# Patient Record
Sex: Female | Born: 1960 | Race: White | Hispanic: No | Marital: Married | Smoking: Never smoker
Health system: Southern US, Community
[De-identification: ages and names within clinical notes are randomized; demographics above are authoritative.]

## PROBLEM LIST (undated history)

## (undated) DIAGNOSIS — R002 Palpitations: Secondary | ICD-10-CM

## (undated) DIAGNOSIS — I700 Atherosclerosis of aorta: Secondary | ICD-10-CM

## (undated) DIAGNOSIS — E785 Hyperlipidemia, unspecified: Secondary | ICD-10-CM

## (undated) DIAGNOSIS — R931 Abnormal findings on diagnostic imaging of heart and coronary circulation: Secondary | ICD-10-CM

## (undated) DIAGNOSIS — U071 COVID-19: Secondary | ICD-10-CM

## (undated) DIAGNOSIS — I2510 Atherosclerotic heart disease of native coronary artery without angina pectoris: Secondary | ICD-10-CM

## (undated) HISTORY — DX: Atherosclerotic heart disease of native coronary artery without angina pectoris: I25.10

## (undated) HISTORY — DX: Abnormal findings on diagnostic imaging of heart and coronary circulation: R93.1

## (undated) HISTORY — PX: OTHER SURGICAL HISTORY: SHX169

## (undated) HISTORY — DX: Atherosclerosis of aorta: I70.0

## (undated) HISTORY — DX: Hyperlipidemia, unspecified: E78.5

---

## 2006-10-21 ENCOUNTER — Encounter: Admission: RE | Admit: 2006-10-21 | Discharge: 2006-10-21 | Payer: Self-pay | Admitting: Family Medicine

## 2006-10-21 ENCOUNTER — Other Ambulatory Visit: Admission: RE | Admit: 2006-10-21 | Discharge: 2006-10-21 | Payer: Self-pay | Admitting: Obstetrics and Gynecology

## 2007-03-05 ENCOUNTER — Encounter: Admission: RE | Admit: 2007-03-05 | Discharge: 2007-03-05 | Payer: Self-pay | Admitting: Obstetrics and Gynecology

## 2007-11-11 ENCOUNTER — Encounter: Admission: RE | Admit: 2007-11-11 | Discharge: 2007-11-11 | Payer: Self-pay | Admitting: Obstetrics and Gynecology

## 2007-12-03 ENCOUNTER — Other Ambulatory Visit: Admission: RE | Admit: 2007-12-03 | Discharge: 2007-12-03 | Payer: Self-pay | Admitting: Obstetrics and Gynecology

## 2008-11-28 ENCOUNTER — Encounter: Admission: RE | Admit: 2008-11-28 | Discharge: 2008-11-28 | Payer: Self-pay | Admitting: Obstetrics and Gynecology

## 2008-12-05 ENCOUNTER — Other Ambulatory Visit: Admission: RE | Admit: 2008-12-05 | Discharge: 2008-12-05 | Payer: Self-pay | Admitting: Obstetrics and Gynecology

## 2009-12-07 ENCOUNTER — Encounter: Admission: RE | Admit: 2009-12-07 | Discharge: 2009-12-07 | Payer: Self-pay | Admitting: Obstetrics and Gynecology

## 2009-12-07 ENCOUNTER — Other Ambulatory Visit: Admission: RE | Admit: 2009-12-07 | Discharge: 2009-12-07 | Payer: Self-pay | Admitting: Obstetrics and Gynecology

## 2010-12-31 ENCOUNTER — Other Ambulatory Visit: Payer: Self-pay | Admitting: Obstetrics and Gynecology

## 2010-12-31 DIAGNOSIS — Z1231 Encounter for screening mammogram for malignant neoplasm of breast: Secondary | ICD-10-CM

## 2011-01-17 ENCOUNTER — Ambulatory Visit
Admission: RE | Admit: 2011-01-17 | Discharge: 2011-01-17 | Disposition: A | Discharge status: Home / Self Care | Payer: BC Managed Care – PPO | Source: Ambulatory Visit | Attending: Obstetrics and Gynecology | Admitting: Obstetrics and Gynecology

## 2011-01-17 ENCOUNTER — Other Ambulatory Visit: Payer: Self-pay | Admitting: Obstetrics and Gynecology

## 2011-01-17 ENCOUNTER — Other Ambulatory Visit (HOSPITAL_COMMUNITY)
Admission: RE | Admit: 2011-01-17 | Discharge: 2011-01-17 | Disposition: A | Discharge status: Home / Self Care | Payer: BC Managed Care – PPO | Source: Ambulatory Visit | Attending: Obstetrics and Gynecology | Admitting: Obstetrics and Gynecology

## 2011-01-17 ENCOUNTER — Ambulatory Visit: Payer: Self-pay

## 2011-01-17 DIAGNOSIS — Z1231 Encounter for screening mammogram for malignant neoplasm of breast: Secondary | ICD-10-CM

## 2011-01-17 DIAGNOSIS — Z01419 Encounter for gynecological examination (general) (routine) without abnormal findings: Secondary | ICD-10-CM | POA: Insufficient documentation

## 2014-02-05 ENCOUNTER — Encounter: Payer: Self-pay | Admitting: *Deleted

## 2019-07-06 DIAGNOSIS — Z111 Encounter for screening for respiratory tuberculosis: Secondary | ICD-10-CM | POA: Diagnosis not present

## 2019-07-06 DIAGNOSIS — Z13220 Encounter for screening for lipoid disorders: Secondary | ICD-10-CM | POA: Diagnosis not present

## 2019-07-06 DIAGNOSIS — Z0000 Encounter for general adult medical examination without abnormal findings: Secondary | ICD-10-CM | POA: Diagnosis not present

## 2019-07-27 DIAGNOSIS — R922 Inconclusive mammogram: Secondary | ICD-10-CM | POA: Diagnosis not present

## 2019-12-16 DIAGNOSIS — Z03818 Encounter for observation for suspected exposure to other biological agents ruled out: Secondary | ICD-10-CM | POA: Diagnosis not present

## 2019-12-21 DIAGNOSIS — Z1211 Encounter for screening for malignant neoplasm of colon: Secondary | ICD-10-CM | POA: Diagnosis not present

## 2019-12-21 DIAGNOSIS — D122 Benign neoplasm of ascending colon: Secondary | ICD-10-CM | POA: Diagnosis not present

## 2019-12-21 DIAGNOSIS — K640 First degree hemorrhoids: Secondary | ICD-10-CM | POA: Diagnosis not present

## 2019-12-21 DIAGNOSIS — D123 Benign neoplasm of transverse colon: Secondary | ICD-10-CM | POA: Diagnosis not present

## 2020-04-10 DIAGNOSIS — S52501A Unspecified fracture of the lower end of right radius, initial encounter for closed fracture: Secondary | ICD-10-CM | POA: Diagnosis not present

## 2020-04-10 DIAGNOSIS — W010XXA Fall on same level from slipping, tripping and stumbling without subsequent striking against object, initial encounter: Secondary | ICD-10-CM | POA: Diagnosis not present

## 2020-04-10 DIAGNOSIS — G8911 Acute pain due to trauma: Secondary | ICD-10-CM | POA: Diagnosis not present

## 2020-04-10 DIAGNOSIS — S6991XA Unspecified injury of right wrist, hand and finger(s), initial encounter: Secondary | ICD-10-CM | POA: Diagnosis not present

## 2020-04-11 DIAGNOSIS — S6991XA Unspecified injury of right wrist, hand and finger(s), initial encounter: Secondary | ICD-10-CM | POA: Diagnosis not present

## 2020-04-11 DIAGNOSIS — S52501A Unspecified fracture of the lower end of right radius, initial encounter for closed fracture: Secondary | ICD-10-CM | POA: Diagnosis not present

## 2020-04-18 DIAGNOSIS — S52501A Unspecified fracture of the lower end of right radius, initial encounter for closed fracture: Secondary | ICD-10-CM | POA: Diagnosis not present

## 2020-05-02 DIAGNOSIS — S52501A Unspecified fracture of the lower end of right radius, initial encounter for closed fracture: Secondary | ICD-10-CM | POA: Diagnosis not present

## 2020-05-02 DIAGNOSIS — S52501D Unspecified fracture of the lower end of right radius, subsequent encounter for closed fracture with routine healing: Secondary | ICD-10-CM | POA: Diagnosis not present

## 2020-05-23 DIAGNOSIS — S52502D Unspecified fracture of the lower end of left radius, subsequent encounter for closed fracture with routine healing: Secondary | ICD-10-CM | POA: Diagnosis not present

## 2020-05-23 DIAGNOSIS — S52501A Unspecified fracture of the lower end of right radius, initial encounter for closed fracture: Secondary | ICD-10-CM | POA: Diagnosis not present

## 2020-06-21 DIAGNOSIS — S52501A Unspecified fracture of the lower end of right radius, initial encounter for closed fracture: Secondary | ICD-10-CM | POA: Diagnosis not present

## 2020-06-21 DIAGNOSIS — G5621 Lesion of ulnar nerve, right upper limb: Secondary | ICD-10-CM | POA: Diagnosis not present

## 2020-07-27 DIAGNOSIS — Z1231 Encounter for screening mammogram for malignant neoplasm of breast: Secondary | ICD-10-CM | POA: Diagnosis not present

## 2020-08-14 DIAGNOSIS — L309 Dermatitis, unspecified: Secondary | ICD-10-CM | POA: Diagnosis not present

## 2020-08-28 DIAGNOSIS — L309 Dermatitis, unspecified: Secondary | ICD-10-CM | POA: Diagnosis not present

## 2020-10-30 DIAGNOSIS — Z20822 Contact with and (suspected) exposure to covid-19: Secondary | ICD-10-CM | POA: Diagnosis not present

## 2020-11-03 ENCOUNTER — Emergency Department (HOSPITAL_COMMUNITY)
Admission: EM | Admit: 2020-11-03 | Discharge: 2020-11-03 | Disposition: A | Discharge status: Home / Self Care | Payer: BC Managed Care – PPO | Attending: Emergency Medicine | Admitting: Emergency Medicine

## 2020-11-03 ENCOUNTER — Emergency Department (HOSPITAL_COMMUNITY): Payer: BC Managed Care – PPO

## 2020-11-03 ENCOUNTER — Other Ambulatory Visit: Payer: Self-pay

## 2020-11-03 ENCOUNTER — Encounter (HOSPITAL_COMMUNITY): Payer: Self-pay

## 2020-11-03 DIAGNOSIS — Z8616 Personal history of COVID-19: Secondary | ICD-10-CM | POA: Insufficient documentation

## 2020-11-03 DIAGNOSIS — Z7982 Long term (current) use of aspirin: Secondary | ICD-10-CM | POA: Diagnosis not present

## 2020-11-03 DIAGNOSIS — R000 Tachycardia, unspecified: Secondary | ICD-10-CM | POA: Diagnosis not present

## 2020-11-03 DIAGNOSIS — R531 Weakness: Secondary | ICD-10-CM | POA: Insufficient documentation

## 2020-11-03 DIAGNOSIS — R55 Syncope and collapse: Secondary | ICD-10-CM | POA: Diagnosis not present

## 2020-11-03 DIAGNOSIS — R002 Palpitations: Secondary | ICD-10-CM

## 2020-11-03 DIAGNOSIS — U071 COVID-19: Secondary | ICD-10-CM

## 2020-11-03 DIAGNOSIS — I10 Essential (primary) hypertension: Secondary | ICD-10-CM | POA: Diagnosis not present

## 2020-11-03 DIAGNOSIS — R5383 Other fatigue: Secondary | ICD-10-CM | POA: Insufficient documentation

## 2020-11-03 HISTORY — DX: COVID-19: U07.1

## 2020-11-03 HISTORY — DX: Palpitations: R00.2

## 2020-11-03 LAB — COMPREHENSIVE METABOLIC PANEL
ALT: 36 U/L (ref 0–44)
AST: 37 U/L (ref 15–41)
Albumin: 4.5 g/dL (ref 3.5–5.0)
Alkaline Phosphatase: 56 U/L (ref 38–126)
Anion gap: 9 (ref 5–15)
BUN: 11 mg/dL (ref 8–23)
CO2: 24 mmol/L (ref 22–32)
Calcium: 9.3 mg/dL (ref 8.9–10.3)
Chloride: 105 mmol/L (ref 98–111)
Creatinine, Ser: 0.65 mg/dL (ref 0.44–1.00)
GFR, Estimated: >60 mL/min (ref >60)
Glucose, Bld: 89 mg/dL (ref 70–99)
Potassium: 3.8 mmol/L (ref 3.5–5.1)
Sodium: 138 mmol/L (ref 135–145)
Total Bilirubin: 0.8 mg/dL (ref 0.3–1.2)
Total Protein: 8.1 g/dL (ref 6.5–8.1)

## 2020-11-03 LAB — CBC WITH DIFFERENTIAL/PLATELET
Abs Immature Granulocytes: 0.01 10*3/uL (ref 0.00–0.07)
Basophils Absolute: 0 10*3/uL (ref 0.0–0.1)
Basophils Relative: 0 %
Eosinophils Absolute: 0 10*3/uL (ref 0.0–0.5)
Eosinophils Relative: 0 %
HCT: 43.5 % (ref 36.0–46.0)
Hemoglobin: 14.4 g/dL (ref 12.0–15.0)
Immature Granulocytes: 0 %
Lymphocytes Relative: 23 %
Lymphs Abs: 1.3 10*3/uL (ref 0.7–4.0)
MCH: 30.8 pg (ref 26.0–34.0)
MCHC: 33.1 g/dL (ref 30.0–36.0)
MCV: 92.9 fL (ref 80.0–100.0)
Monocytes Absolute: 0.4 10*3/uL (ref 0.1–1.0)
Monocytes Relative: 7 %
Neutro Abs: 4 10*3/uL (ref 1.7–7.7)
Neutrophils Relative %: 70 %
Platelets: 259 10*3/uL (ref 150–400)
RBC: 4.68 MIL/uL (ref 3.87–5.11)
RDW: 12.2 % (ref 11.5–15.5)
WBC: 5.7 10*3/uL (ref 4.0–10.5)
nRBC: 0 % (ref 0.0–0.2)

## 2020-11-03 LAB — TROPONIN I (HIGH SENSITIVITY): Troponin I (High Sensitivity): 3 ng/L (ref <18)

## 2020-11-03 MED ORDER — SODIUM CHLORIDE 0.9 % IV BOLUS
1000.0000 mL | Freq: Once | INTRAVENOUS | Status: AC
  Administered 2020-11-03: 1000 mL via INTRAVENOUS

## 2020-11-03 NOTE — ED Triage Notes (Addendum)
Per EMS- Patient reports that she was diagnosed with Covid 3 days ago. Patient reports today after eating became very fatigued and felt like passing out. Patient denies any SOB or fever.   Patient added during triage that she has been having heart palpitations. Patient states she received a medication for Covid  Yesterday.

## 2020-11-03 NOTE — ED Provider Notes (Signed)
[facility]-EMERGENCY DEPT Provider Note   CSN: [id number] Arrival date & time: 11/03/20  1011     History Chief Complaint  Patient presents with  . Covid Positive  . Fatigue  . Near Syncope  . Palpitations    Lindsay Schmidt is a 60 y.o. female.  HPI   Patient is a 60 year old female history of COVID, palpitations, who presents emergency department today for evaluation of generalized weakness and palpitations.  Patient states she was diagnosed with COVID earlier this week.  She started Paxil yesterday and has had about 3 doses.  Since being diagnosed with COVID she has had palpitations, this started prior to taking Paxil event.  She does have a history of same but they have become more frequent since her COVID diagnosis.  She denies any chest pain, shortness of breath.  She has had some congestion and some lightheadedness as well.  Past Medical History:  Diagnosis Date  . COVID   . Palpitations     There are no problems to display for this patient.   Past Surgical History:  Procedure Laterality Date  . nonsustained atrial tachycardia    . PVCs       OB History   No obstetric history on file.     Family History  Problem Relation Age of Onset  . Coronary artery disease Brother     Social History   Tobacco Use  . Smoking status: Never Smoker  . Smokeless tobacco: Never Used  Vaping Use  . Vaping Use: Never used  Substance Use Topics  . Alcohol use: Yes    Alcohol/week: 0.0 - 5.0 standard drinks  . Drug use: No    Home Medications Prior to Admission medications   Medication Sig Start Date End Date Taking? Authorizing Provider  aspirin 81 MG tablet Take 81 mg by mouth daily.    [provider]  calcium carbonate (OS-CAL) 600 MG TABS tablet Take 600 mg by mouth 2 (two) times daily with a meal.    [provider]  vitamin E 400 UNIT capsule Take 400 Units by mouth daily.    [provider]    Allergies     Patient has no known allergies.  Review of Systems   Review of Systems  Constitutional: Negative for chills and fever.  HENT: Positive for congestion. Negative for ear pain and sore throat.   Eyes: Negative for visual disturbance.  Respiratory: Negative for cough and shortness of breath.   Cardiovascular: Positive for palpitations. Negative for chest pain.  Gastrointestinal: Negative for abdominal pain, constipation, diarrhea, nausea and vomiting.  Genitourinary: Negative for dysuria and hematuria.  Musculoskeletal: Negative for back pain.  Skin: Negative for rash.  Neurological: Positive for light-headedness. Negative for syncope.  All other systems reviewed and are negative.   Physical Exam Updated Vital Signs BP 140/83   Pulse 72   Temp 97.6 F (36.4 C) (Oral)   Resp 16   Ht 5\' 4"  (1.626 m)   Wt 66.2 kg   SpO2 100%   BMI 25.06 kg/m   Physical Exam Vitals and nursing note reviewed.  Constitutional:      General: She is not in acute distress.    Appearance: She is well-developed.  HENT:     Head: Normocephalic and atraumatic.  Eyes:     Conjunctiva/sclera: Conjunctivae normal.  Cardiovascular:     Rate and Rhythm: Normal rate and regular rhythm.     Heart sounds: No murmur heard.  Pulmonary:     Effort: Pulmonary effort is normal. No respiratory distress.     Breath sounds: Normal breath sounds.  Abdominal:     Palpations: Abdomen is soft.     Tenderness: There is no abdominal tenderness.  Musculoskeletal:     Cervical back: Neck supple.  Skin:    General: Skin is warm and dry.  Neurological:     Mental Status: She is alert.     ED Results / Procedures / Treatments   Labs (all labs ordered are listed, but only abnormal results are displayed) Labs Reviewed  CBC WITH DIFFERENTIAL/PLATELET  COMPREHENSIVE METABOLIC PANEL  TROPONIN I (HIGH SENSITIVITY)  TROPONIN I (HIGH SENSITIVITY)    EKG EKG Interpretation  Date/Time:  Friday Nov 03 2020  13:05:30 EDT Ventricular Rate:  80 PR Interval:  141 QRS Duration: 80 QT Interval:  397 QTC Calculation: 458 R Axis:   46 Text Interpretation: Sinus rhythm Biatrial enlargement Confirmed by Kristine Royal [phone]) on 11/03/2020 2:52:50 PM   Radiology DG Chest 1 View  Result Date: 11/03/2020 CLINICAL DATA:  60 year old who tested positive for COVID-19 3 days ago. Near syncopal episode after eating earlier today. EXAM: CHEST  1 VIEW COMPARISON:  None. FINDINGS: Cardiac silhouette and mediastinal contours normal in appearance for the AP portable technique. Pulmonary parenchyma clear. Bronchovascular markings normal. Pulmonary vascularity normal. No pneumothorax. No visible pleural effusions. IMPRESSION: No acute cardiopulmonary disease. Electronically Signed   By: Hulan Saas M.D.   On: 11/03/2020 12:01    Procedures Procedures   Medications Ordered in ED Medications  sodium chloride 0.9 % bolus 1,000 mL (0 mLs Intravenous Stopped 11/03/20 1635)    ED Course  I have reviewed the triage vital signs and the nursing notes.  Pertinent labs & imaging results that were available during my care of the patient were reviewed by me and considered in my medical decision making (see chart for details).    MDM Rules/Calculators/A&P                          60 year old female presenting the emergency department today for evaluation of palpitations in setting of covid.   Reviewed/interpreted labs CBC, CMP are unremarkable.  Troponin is negative.  I have low suspicion for ACS.  Her EKG is reassuring.  Her chest x-ray does not show any acute cardiopulmonary disease.  She was given IV fluids in the ED and was able to tolerate p.o. and reported that she did no longer felt lightheaded.  I suspect that she may be somewhat volume depleted due to recent COVID.  She also expressed concern about possible adverse effects of Paxil but I did advise that if she wanted to discontinue this medication she is more  than welcome to do so, it is unclear whether her symptoms are related to an adverse effect from the drug versus from COVID itself.  She is advised to follow-up with PCP and return to the ED for new or worsening symptoms.  She voiced understanding the plan and reasons to return.  All questions answered.  Patient stable for discharge.   Final Clinical Impression(s) / ED Diagnoses Final diagnoses:  Palpitations  COVID    Rx / DC Orders ED Discharge Orders    None       Rayne Du 11/03/20 1757    Wynetta Fines, MD 11/04/20 2313

## 2020-11-03 NOTE — Discharge Instructions (Signed)
Make sure to stay well hydrated.  Please follow up with your primary care provider within 5-7 days for re-evaluation of your symptoms. If you do not have a primary care provider, information for a healthcare clinic has been provided for you to make arrangements for follow up care. I have also given you information to follow up with Cardiology. Please call the office to schedule an appointment for follow up. Please return to the emergency department for any new or worsening symptoms.

## 2020-11-03 NOTE — ED Provider Notes (Signed)
Emergency Medicine Provider Triage Evaluation Note  Rexine Gowens , a 60 y.o. female  was evaluated in triage.  Pt complains of generalized weakness. Dx with covid earlier this week and started paxlovid yesterday. Since then feels very weak and is having palpitations.  Review of Systems  Positive: Weakness, palpitations Negative: Chest pain  Physical Exam  BP (!) 156/98 (BP Location: Left Arm)   Pulse 84   Temp 98.8 F (37.1 C) (Oral)   Resp 16   Ht 5\' 4"  (1.626 m)   Wt 66.2 kg   SpO2 98%   BMI 25.06 kg/m  Gen:   Awake, no distress   Resp:  Normal effort  MSK:   Moves extremities without difficulty  Other:  Heart with rrr  Medical Decision Making  Medically screening exam initiated at 11:26 AM.  Appropriate orders placed.  Merced Brougham was informed that the remainder of the evaluation will be completed by another provider, this initial triage assessment does not replace that evaluation, and the importance of remaining in the ED until their evaluation is complete.     Chryl Heck 11/03/20 1126    11/05/20, MD 11/04/20 [phone]

## 2020-11-28 DIAGNOSIS — R002 Palpitations: Secondary | ICD-10-CM | POA: Diagnosis not present

## 2020-11-28 DIAGNOSIS — U071 COVID-19: Secondary | ICD-10-CM | POA: Diagnosis not present

## 2021-02-05 ENCOUNTER — Encounter: Payer: Self-pay | Admitting: *Deleted

## 2021-02-05 ENCOUNTER — Other Ambulatory Visit: Payer: Self-pay

## 2021-02-05 ENCOUNTER — Encounter: Payer: Self-pay | Admitting: Cardiology

## 2021-02-05 ENCOUNTER — Ambulatory Visit: Payer: BC Managed Care – PPO | Admitting: Cardiology

## 2021-02-05 ENCOUNTER — Ambulatory Visit (INDEPENDENT_AMBULATORY_CARE_PROVIDER_SITE_OTHER): Payer: BC Managed Care – PPO | Admitting: Cardiology

## 2021-02-05 VITALS — BP 128/83 | HR 85 | Ht 64.0 in | Wt 152.0 lb

## 2021-02-05 DIAGNOSIS — R002 Palpitations: Secondary | ICD-10-CM

## 2021-02-05 DIAGNOSIS — R42 Dizziness and giddiness: Secondary | ICD-10-CM

## 2021-02-05 LAB — BASIC METABOLIC PANEL
BUN/Creatinine Ratio: 25 (ref 12–28)
BUN: 19 mg/dL (ref 8–27)
CO2: 23 mmol/L (ref 20–29)
Calcium: 9.4 mg/dL (ref 8.7–10.3)
Chloride: 104 mmol/L (ref 96–106)
Creatinine, Ser: 0.75 mg/dL (ref 0.57–1.00)
Glucose: 84 mg/dL (ref 65–99)
Potassium: 4.1 mmol/L (ref 3.5–5.2)
Sodium: 139 mmol/L (ref 134–144)
eGFR: 90 mL/min/{1.73_m2} (ref >59)

## 2021-02-05 LAB — CBC
Hematocrit: 37.1 % (ref 34.0–46.6)
Hemoglobin: 12.5 g/dL (ref 11.1–15.9)
MCH: 31.2 pg (ref 26.6–33.0)
MCHC: 33.7 g/dL (ref 31.5–35.7)
MCV: 93 fL (ref 79–97)
Platelets: 279 10*3/uL (ref 150–450)
RBC: 4.01 x10E6/uL (ref 3.77–5.28)
RDW: 12.7 % (ref 11.7–15.4)
WBC: 7 10*3/uL (ref 3.4–10.8)

## 2021-02-05 LAB — TSH: TSH: 2.92 u[IU]/mL (ref 0.450–4.500)

## 2021-02-05 NOTE — Progress Notes (Signed)
Cardiology CONSULT Note    Date:  02/05/2021   ID:  Lindsay Schmidt, DOB [birth year], MRN [id number]  PCP:  [facility]  Cardiologist:  Armanda Magic, MD   Chief Complaint  Patient presents with   New Patient (Initial Visit)    Dizziness and palpitations     History of Present Illness:  Lindsay Schmidt is a 60 y.o. female who is being seen today for the evaluation of Palpitations and near syncope at the request of [facility]*.  This is a 60yo female with a hx of COVID 19 in May 2022.  She presented to the ER after testing + for COVID with complaints of palpitations and dizziness with near syncope.  EKG showed NSR and Cxray normal. She was felt to be dehydrated and was given a saline bolus and discharged home.  She was seen back by her PCP in June and was feeling better but had 2 recurrent episodes of dizziness while outside on very hot days.  She also noticed some mild dyspnea when yelling but no SOB or CP.  She requested Cardiology referral and is now here for evaluation.    She tells me that occasionally she will notice some swimmy headedness at times.  She says that the room does not spin but feels off and is hard to describe.  She says that she had an episode of losing her peripheral vision lasting a second while standing and has not had any other episodes.  She still has a lot of palpitations and also feels fatigued and her entire body feels heavy.  Her palpitations occur on a daily basis. She denies any chest pain or pressure and no SOB, DOE, PND or orthopnea.  She has not had any LE edema.  EKG in ER was normal.  Of note she had a stress echo in 2019 that was completely normal.    Past Medical History:  Diagnosis Date   COVID    Palpitations     Past Surgical History:  Procedure Laterality Date   nonsustained atrial tachycardia     PVCs      Current Medications: No outpatient medications have been marked as taking for the 02/05/21 encounter  (Office Visit) with Quintella Reichert, MD.    Allergies:   Patient has no known allergies.   Social History   Socioeconomic History   Marital status: Married    Spouse name: Not on file   Number of children: Not on file   Years of education: Not on file   Highest education level: Not on file  Occupational History   Not on file  Tobacco Use   Smoking status: Never   Smokeless tobacco: Never  Vaping Use   Vaping Use: Never used  Substance and Sexual Activity   Alcohol use: Yes    Alcohol/week: 0.0 - 5.0 standard drinks   Drug use: No   Sexual activity: Yes  Other Topics Concern   Not on file  Social History Narrative   Not on file   Social Determinants of Health   Financial Resource Strain: Not on file  Food Insecurity: Not on file  Transportation Needs: Not on file  Physical Activity: Not on file  Stress: Not on file  Social Connections: Not on file     Family History:  The patient's family history includes Coronary artery disease in her brother.   ROS:   Please see the history of present illness.  ROS All other systems reviewed and are negative.  No flowsheet data found.   PHYSICAL EXAM:   VS:  There were no vitals taken for this visit.   GEN: Well nourished, well developed, in no acute distress  HEENT: normal  Neck: no JVD, carotid bruits, or masses Cardiac: RRR; no murmurs, rubs, or gallops,no edema.  Intact distal pulses bilaterally.  Respiratory:  clear to auscultation bilaterally, normal work of breathing GI: soft, nontender, nondistended, + BS MS: no deformity or atrophy  Skin: warm and dry, no rash Neuro:  Alert and Oriented x 3, Strength and sensation are intact Psych: euthymic mood, full affect  Wt Readings from Last 3 Encounters:  11/03/20 146 lb (66.2 kg)      Studies/Labs Reviewed:   EKG:  EKG is not ordered today.    Recent Labs: 11/03/2020: ALT 36; BUN 11; Creatinine, Ser 0.65; Hemoglobin 14.4; Platelets 259; Potassium 3.8; Sodium  138   Lipid Panel No results found for: CHOL, TRIG, HDL, CHOLHDL, VLDL, LDLCALC, LDLDIRECT  Additional studies/ records that were reviewed today include:  EKG and ER notes    ASSESSMENT:    1. Palpitations   2. Dizziness      PLAN:  In order of problems listed above:   Palpitations -she has a long history of palpitations and wore a Holter monitor in 2019 but do not have the results but tells me that it showed PVCs and tachycardia -she has palpitations daily and at night will wake up at night with her heart racing>>seem to be worse during illness, lack of sleep and stress  2.  Dizziness -this does not sound like vertigo -orthostatic BPs in the office today were normal -encouraged her to stay hydrated and avoid standing for long periods of time  3.  Fatigue -unlikely cardiac  -stress echo in 2019 at outside hospital reviewed and showed no ischemia -check TSH, CBC, BMET -check coronary Ca score to assess cardiac risk  Time Spent: 25 minutes total time of encounter, including 15 minutes spent in face-to-face patient care on the date of this encounter. This time includes coordination of care and counseling regarding above mentioned problem list. Remainder of non-face-to-face time involved reviewing chart documents/testing relevant to the patient encounter and documentation in the medical record. I have independently reviewed documentation from referring provider  Medication Adjustments/Labs and Tests Ordered: Current medicines are reviewed at length with the patient today.  Concerns regarding medicines are outlined above.  Medication changes, Labs and Tests ordered today are listed in the Patient Instructions below.  There are no Patient Instructions on file for this visit.   Signed, Armanda Magic, MD  02/05/2021 10:40 AM    [facility] [address] [place], [place], [place]  [address] Phone: [phone]; Fax: [phone]

## 2021-02-05 NOTE — Progress Notes (Signed)
Patient ID: Lindsay Schmidt, female   DOB: 11-01-1960, 60 y.o.   MRN: [id number] Patient enrolled for [employer] to ship a 30 day cardiac event monitor to the patients home.

## 2021-02-05 NOTE — Patient Instructions (Addendum)
Medication Instructions:  Your physician recommends that you continue on your current medications as directed. Please refer to the Current Medication list given to you today.  *If you need a refill on your cardiac medications before your next appointment, please call your pharmacy*  Lab Work: TIODAY: TSH, BMET, CBC If you have labs (blood work) drawn today and your tests are completely normal, you will receive your results only by: MyChart Message (if you have MyChart) OR A paper copy in the mail If you have any lab test that is abnormal or we need to change your treatment, we will call you to review the results.   Testing/Procedures: Your physician has recommended that you have a calcium score CT scan.   Your physician has recommended that you wear an event monitor. Event monitors are medical devices that record the heart's electrical activity. Doctors most often [place] these monitors to diagnose arrhythmias. Arrhythmias are problems with the speed or rhythm of the heartbeat. The monitor is a small, portable device. You can wear one while you do your normal daily activities. This is usually used to diagnose what is causing palpitations/syncope (passing out).  Follow-Up: At [facility], you and your health needs are our priority.  As part of our continuing mission to provide you with exceptional heart care, we have created designated Provider Care Teams.  These Care Teams include your primary Cardiologist (physician) and Advanced Practice Providers (APPs -  Physician Assistants and Nurse Practitioners) who all work together to provide you with the care you need, when you need it.  Follow up with Dr. Mayford Knife as needed based on results of testing.    [employer] Cardiac Event Monitor Instructions Your physician has requested you wear your cardiac event monitor for _30_ days, (1-30). [employer] may call or text to confirm a shipping address. The monitor will be sent to a land address via  UPS. [employer] will not ship a monitor to a PO BOX. It typically takes 3-5 days to receive your monitor after it has been enrolled. [employer] will assist with USPS tracking if your package is delayed. The telephone number for [employer] is [phone]. Once you have received your monitor, please review the enclosed instructions. Instruction tutorials can also be viewed under help and settings on the enclosed cell phone. Your monitor has already been registered assigning a specific monitor serial # to you.  Applying the monitor Remove cell phone from case and turn it on. The cell phone works as IT consultant and needs to be within [employer] of you at all times. The cell phone will need to be charged on a daily basis. We recommend you plug the cell phone into the enclosed charger at your bedside table every night.  Monitor batteries: You will receive two monitor batteries labelled #1 and #2. These are your recorders. Plug battery #2 onto the second connection on the enclosed charger. Keep one battery on the charger at all times. This will keep the monitor battery deactivated. It will also keep it fully charged for when you need to switch your monitor batteries. A small light will be blinking on the battery emblem when it is charging. The light on the battery emblem will remain on when the battery is fully charged.  Open package of a Monitor strip. Insert battery #1 into black hood on strip and gently squeeze monitor battery onto connection as indicated in instruction booklet. Set aside while preparing skin.  Choose location for your strip, vertical or horizontal, as indicated  in the instruction booklet. Shave to remove all hair from location. There cannot be any lotions, oils, powders, or colognes on skin where monitor is to be applied. Wipe skin clean with enclosed Saline wipe. Dry skin completely.  Peel paper labeled #1 off the back of the Monitor strip exposing the adhesive. Place  the monitor on the chest in the vertical or horizontal position shown in the instruction booklet. One arrow on the monitor strip must be pointing upward. Carefully remove paper labeled #2, attaching remainder of strip to your skin. Try not to create any folds or wrinkles in the strip as you apply it.  Firmly press and release the circle in the center of the monitor battery. You will hear a small beep. This is turning the monitor battery on. The heart emblem on the monitor battery will light up every 5 seconds if the monitor battery in turned on and connected to the patient securely. Do not push and hold the circle down as this turns the monitor battery off. The cell phone will locate the monitor battery. A screen will appear on the cell phone checking the connection of your monitor strip. This may read poor connection initially but change to good connection within the next minute. Once your monitor accepts the connection you will hear a series of 3 beeps followed by a climbing crescendo of beeps. A screen will appear on the cell phone showing the two monitor strip placement options. Touch the picture that demonstrates where you applied the monitor strip.  Your monitor strip and battery are waterproof. You are able to shower, bathe, or swim with the monitor on. They just ask you do not submerge deeper than 3 feet underwater. We recommend removing the monitor if you are swimming in a lake, river, or ocean.  Your monitor battery will need to be switched to a fully charged monitor battery approximately once a week. The cell phone will alert you of an action which needs to be made.  On the cell phone, tap for details to reveal connection status, monitor battery status, and cell phone battery status. The green dots indicates your monitor is in good status. A red dot indicates there is something that needs your attention.  To record a symptom, click the circle on the monitor battery. In 30-60  seconds a list of symptoms will appear on the cell phone. Select your symptom and tap save. Your monitor will record a sustained or significant arrhythmia regardless of you clicking the button. Some patients do not feel the heart rhythm irregularities. [employer] will notify us of any serious or critical events.  Refer to instruction booklet for instructions on switching batteries, changing strips, the Do not disturb or Pause features, or any additional questions.  Call [employer] at [phone], to confirm your monitor is transmitting and record your baseline. They will answer any questions you may have regarding the monitor instructions at that time.  Returning the monitor to [employer] Place all equipment back into blue box. Peel off strip of paper to expose adhesive and close box securely. There is a prepaid UPS shipping label on this box. Drop in a UPS drop box, or at a UPS facility like [employer]. You may also contact [employer] to arrange UPS to pick up monitor package at your home.

## 2021-02-05 NOTE — Addendum Note (Signed)
Addended by: Theresia Majors on: 02/05/2021 11:11 AM   Modules accepted: Orders

## 2021-02-10 ENCOUNTER — Ambulatory Visit (INDEPENDENT_AMBULATORY_CARE_PROVIDER_SITE_OTHER): Payer: BC Managed Care – PPO

## 2021-02-10 DIAGNOSIS — R42 Dizziness and giddiness: Secondary | ICD-10-CM

## 2021-02-10 DIAGNOSIS — R002 Palpitations: Secondary | ICD-10-CM

## 2021-02-22 ENCOUNTER — Other Ambulatory Visit: Payer: Self-pay

## 2021-02-22 ENCOUNTER — Ambulatory Visit (INDEPENDENT_AMBULATORY_CARE_PROVIDER_SITE_OTHER)
Admission: RE | Admit: 2021-02-22 | Discharge: 2021-02-22 | Disposition: A | Discharge status: Home / Self Care | Payer: Self-pay | Source: Ambulatory Visit | Attending: Cardiology | Admitting: Cardiology

## 2021-02-22 DIAGNOSIS — Z124 Encounter for screening for malignant neoplasm of cervix: Secondary | ICD-10-CM | POA: Diagnosis not present

## 2021-02-22 DIAGNOSIS — Z1211 Encounter for screening for malignant neoplasm of colon: Secondary | ICD-10-CM | POA: Diagnosis not present

## 2021-02-22 DIAGNOSIS — Z13220 Encounter for screening for lipoid disorders: Secondary | ICD-10-CM | POA: Diagnosis not present

## 2021-02-22 DIAGNOSIS — R7303 Prediabetes: Secondary | ICD-10-CM | POA: Diagnosis not present

## 2021-02-22 DIAGNOSIS — R002 Palpitations: Secondary | ICD-10-CM

## 2021-02-22 DIAGNOSIS — Z1231 Encounter for screening mammogram for malignant neoplasm of breast: Secondary | ICD-10-CM | POA: Diagnosis not present

## 2021-02-22 DIAGNOSIS — Z0000 Encounter for general adult medical examination without abnormal findings: Secondary | ICD-10-CM | POA: Diagnosis not present

## 2021-02-26 ENCOUNTER — Encounter: Payer: Self-pay | Admitting: Cardiology

## 2021-02-26 DIAGNOSIS — R931 Abnormal findings on diagnostic imaging of heart and coronary circulation: Secondary | ICD-10-CM | POA: Insufficient documentation

## 2021-02-28 ENCOUNTER — Telehealth: Payer: Self-pay | Admitting: Cardiology

## 2021-02-28 NOTE — Telephone Encounter (Signed)
Follow Up:     Patient is returning Lindsay Schmidt's call from today, concerning her CT results.

## 2021-02-28 NOTE — Telephone Encounter (Signed)
Lindsay Reichert, MD  02/26/2021  9:10 PM EDT     Elevated coronary Ca score - please have her come in for an FLP  The patient has been notified of the result and verbalized understanding.  All questions (if any) were answered. Theresia Majors, RN 02/28/2021 4:31 PM  Patient states that she had lab work done at her PCP last week. Will request results from them for Dr. Mayford Knife to review.

## 2021-03-06 ENCOUNTER — Encounter: Payer: Self-pay | Admitting: Cardiology

## 2021-03-06 DIAGNOSIS — E785 Hyperlipidemia, unspecified: Secondary | ICD-10-CM | POA: Insufficient documentation

## 2021-03-07 ENCOUNTER — Telehealth: Payer: Self-pay

## 2021-03-07 DIAGNOSIS — R002 Palpitations: Secondary | ICD-10-CM

## 2021-03-07 DIAGNOSIS — E785 Hyperlipidemia, unspecified: Secondary | ICD-10-CM

## 2021-03-07 DIAGNOSIS — R931 Abnormal findings on diagnostic imaging of heart and coronary circulation: Secondary | ICD-10-CM

## 2021-03-07 MED ORDER — ATORVASTATIN CALCIUM 20 MG PO TABS: 20.0000 mg | ORAL_TABLET | Freq: Every day | ORAL | 3 refills | Status: DC

## 2021-03-07 NOTE — Telephone Encounter (Signed)
Left message for patient to call back  

## 2021-03-07 NOTE — Telephone Encounter (Signed)
-----   Message from Quintella Reichert, MD sent at 03/06/2021  7:54 PM EDT ----- LDL goal < 70 due to coronary Calcifications - LDL too high - start Atorvastatin 20mg  daily and repeat FLP and ALT in 6 weeks ----- Message ----- From: Sent: 03/06/2021  12:45 PM EDT To: 03/08/2021, MD

## 2021-03-07 NOTE — Telephone Encounter (Signed)
Spoke with the patient and advised her on recommendations from Dr. Mayford Knife. She will start on atorvastatin 20 mg daily. Lab work has been scheduled.

## 2021-03-21 DIAGNOSIS — Z23 Encounter for immunization: Secondary | ICD-10-CM | POA: Diagnosis not present

## 2021-03-21 DIAGNOSIS — L578 Other skin changes due to chronic exposure to nonionizing radiation: Secondary | ICD-10-CM | POA: Diagnosis not present

## 2021-03-21 DIAGNOSIS — L821 Other seborrheic keratosis: Secondary | ICD-10-CM | POA: Diagnosis not present

## 2021-03-21 DIAGNOSIS — L814 Other melanin hyperpigmentation: Secondary | ICD-10-CM | POA: Diagnosis not present

## 2021-03-22 ENCOUNTER — Telehealth: Payer: Self-pay

## 2021-03-22 DIAGNOSIS — R002 Palpitations: Secondary | ICD-10-CM

## 2021-03-22 NOTE — Telephone Encounter (Signed)
-----   Message from Loa Socks, LPN sent at 0/63/0160  9:41 AM EDT -----  ----- Message ----- From: Quintella Reichert, MD Sent: 03/19/2021   5:39 PM EDT To: [facility] Triage  Heart monitor showed occasional extra heart beats from the top and bottom of heart which are benign.  Start Toprol XL 25mg  daily and followup with , PA in 4 weeks

## 2021-03-22 NOTE — Telephone Encounter (Signed)
The patient has been notified of the result and verbalized understanding.  All questions (if any) were answered. Theresia Majors, RN 03/22/2021 4:01 PM   Patient states that she has tried Toprol XL in the past. She states that her palpitations are not very bothersome and she would rather not take any medication.  I have scheduled her for an echocardiogram.

## 2021-04-09 ENCOUNTER — Ambulatory Visit (HOSPITAL_COMMUNITY): Payer: BC Managed Care – PPO | Attending: Internal Medicine

## 2021-04-09 ENCOUNTER — Other Ambulatory Visit: Payer: Self-pay

## 2021-04-09 DIAGNOSIS — R002 Palpitations: Secondary | ICD-10-CM | POA: Diagnosis not present

## 2021-04-09 LAB — ECHOCARDIOGRAM COMPLETE
Area-P 1/2: 2.49 cm2
S' Lateral: 3 cm

## 2021-05-02 ENCOUNTER — Telehealth: Payer: Self-pay | Admitting: Cardiology

## 2021-05-02 NOTE — Telephone Encounter (Signed)
Left the pt a message to call the office back. 

## 2021-05-02 NOTE — Telephone Encounter (Signed)
Patient is requesting an order for an A1C panel. 

## 2021-05-03 ENCOUNTER — Other Ambulatory Visit: Payer: BC Managed Care – PPO

## 2021-05-31 ENCOUNTER — Encounter: Payer: Self-pay | Admitting: Cardiology

## 2021-06-04 ENCOUNTER — Other Ambulatory Visit: Payer: BC Managed Care – PPO | Admitting: *Deleted

## 2021-06-04 ENCOUNTER — Other Ambulatory Visit: Payer: Self-pay

## 2021-06-04 DIAGNOSIS — E785 Hyperlipidemia, unspecified: Secondary | ICD-10-CM | POA: Diagnosis not present

## 2021-06-04 DIAGNOSIS — R931 Abnormal findings on diagnostic imaging of heart and coronary circulation: Secondary | ICD-10-CM | POA: Diagnosis not present

## 2021-06-04 LAB — ALT: ALT: 21 IU/L (ref 0–32)

## 2021-06-04 LAB — LIPID PANEL
Chol/HDL Ratio: 2.3 ratio (ref 0.0–4.4)
Cholesterol, Total: 139 mg/dL (ref 100–199)
HDL: 60 mg/dL (ref >39)
LDL Chol Calc (NIH): 66 mg/dL (ref 0–99)
Triglycerides: 65 mg/dL (ref 0–149)
VLDL Cholesterol Cal: 13 mg/dL (ref 5–40)

## 2021-07-30 DIAGNOSIS — Z1231 Encounter for screening mammogram for malignant neoplasm of breast: Secondary | ICD-10-CM | POA: Diagnosis not present

## 2021-08-23 DIAGNOSIS — R7303 Prediabetes: Secondary | ICD-10-CM | POA: Diagnosis not present

## 2021-09-27 ENCOUNTER — Telehealth: Payer: Self-pay | Admitting: Cardiology

## 2021-09-27 NOTE — Telephone Encounter (Signed)
Patient called reporting she had an episode about 45 mins prior to call when she was in the kitchen and felt strong episode of palpitations. Became light-headed and dizzy briefly. Episode resolved. She feels tired but otherwise at her baseline. Does not have a BP cuff or apple watch. Does have hx of palpitations with PVCs. Not on BB therapy presently. Advised if she has recurrent symptoms/prolonged or chest pain/dyspnea/syncope etc needs to be evaluated in the ED. Otherwise will route to office staff to arrange for outpatient visit. She was agreeable to this plan and thanked me for follow up call.  ?

## 2021-10-06 NOTE — Progress Notes (Signed)
?Cardiology Office Note:   ? ?Date:  10/09/2021  ? ?ID:  Lindsay Schmidt, DOB August 25, 1960, MRN [id number] ? ?PCP:  [facility] ?  ?[facility] Providers ?Cardiologist:  Armanda Magic, MD    ? ?Referring MD: [facility]*  ? ?Chief Complaint: follow-up palpitations, lightheadedness ? ?History of Present Illness:   ? ?Lindsay Schmidt is a very pleasant 61 y.o. female with a hx of palpitations, frequent PVCs and PACs, hyperlipidemia, mild mitral regurgitation, and elevated coronary calcium score. ? ?Referred by PCP to cardiology for evaluation of palpitations with near-syncope and seen by Dr. Mayford Knife on 02/05/21. She described palpitations and dizziness onset after COVID infection May 2022. Described daily palpitations, "feeling off" and an episode of losing her peripheral vision lasting a second while standing. Also feeling fatigued and entire body feels heavy. Stress echo 2019 was normal. EKG in ER 11/03/20 revealed NSR. Coronary calcium score was 153, 90th percentile for age/race/sex matched controls. Cardiac monitor revealed predominant sinus rhythm, occasional PACs, atrial couplets an triplets, rare PVC. Echo revealed normal LVEF 55% and mild mitral regurgitation.  ? ?She called on-call provider on 09/27/21 with c/o palpitations. She reported she had an episode of sudden palpitations and became dizzy and light-headed. She did not have any way to monitor heart rate or rhythm such as BP cuff or smart watch. ED precautions were advised and follow-up appointment was scheduled. ? ?Today, she is here alone for follow-up.  She reports on 09/27/2021 she was in her kitchen when she began to feel her chest pain pounding as well as a funny feeling in her head like she might pass out.  Her body felt limp.  She was able to lean against her counter, drink some water and did not pass out. History of PVCs from many years ago before moving to [place]. Told by another provider she had frequent PVCs. Underwent nuclear  stress test which was normal in 2019. Is an avid walker and avoids caffeine. Stays well hydrated with water. Had less than 5 events of palpitations between Christmas and first of April, episode 4/6 was worst in a long time. Feels more palpitations when she is stressed and with lack of sleep. She denies chest pain, shortness of breath, lower extremity edema, fatigue, diaphoresis, weakness, presyncope, syncope, orthopnea, and PND. ? ? ?Past Medical History:  ?Diagnosis Date  ? Agatston coronary artery calcium score between 100 and 400   ? COVID   ? HLD (hyperlipidemia)   ? LDL goal < 70  ? Palpitations   ? ? ?Past Surgical History:  ?Procedure Laterality Date  ? nonsustained atrial tachycardia    ? PVCs    ? ? ?Current Medications: ?Current Meds  ?Medication Sig  ? atorvastatin (LIPITOR) 20 MG tablet Take 1 tablet (20 mg total) by mouth daily.  ? metoprolol succinate (TOPROL XL) 25 MG 24 hr tablet Take 0.5 tablets (12.5 mg total) by mouth daily.  ? propranolol (INDERAL) 10 MG tablet Take 1 tablet (10 mg total) by mouth 3 (three) times daily as needed.  ?  ? ?Allergies:   Patient has no known allergies.  ? ?Social History  ? ?Socioeconomic History  ? Marital status: Married  ?  Spouse name: Not on file  ? Number of children: Not on file  ? Years of education: Not on file  ? Highest education level: Not on file  ?Occupational History  ? Not on file  ?Tobacco Use  ? Smoking status: Never  ?  Smokeless tobacco: Never  ?Vaping Use  ? Vaping Use: Never used  ?Substance and Sexual Activity  ? Alcohol use: Yes  ?  Alcohol/week: 0.0 - 5.0 standard drinks  ? Drug use: No  ? Sexual activity: Yes  ?Other Topics Concern  ? Not on file  ?Social History Narrative  ? Not on file  ? ?Social Determinants of Health  ? ?Financial Resource Strain: Not on file  ?Food Insecurity: Not on file  ?Transportation Needs: Not on file  ?Physical Activity: Not on file  ?Stress: Not on file  ?Social Connections: Not on file  ?  ? ?Family History: ?The  patient's family history includes Coronary artery disease in her brother. ? ?ROS:   ?Please see the history of present illness.   ?+palpitations ?All other systems reviewed and are negative. ? ?Labs/Other Studies Reviewed:   ? ?The following studies were reviewed today: ? ?Echo 04/09/21 ? ?Left Ventricle: Left ventricular ejection fraction by 3D volume is 55 %.  ?The left ventricle has normal function. The left ventricle has no regional  ?wall motion abnormalities. The average left ventricular global  ?longitudinal strain is -19.6 %. The global  ? longitudinal strain is normal. The left ventricular internal cavity size  ?was normal in size. There is no left ventricular hypertrophy. Left  ?ventricular diastolic parameters were normal.  ?Right Ventricle: The right ventricular size is normal. No increase in  ?right ventricular wall thickness. Right ventricular systolic function is  ?normal. There is normal pulmonary artery systolic pressure. The tricuspid  ?regurgitant velocity is 1.94 m/s, and  ? with an assumed right atrial pressure of 3 mmHg, the estimated right  ?ventricular systolic pressure is 18.1 mmHg.  ?Left Atrium: Left atrial size was normal in size.  ?Right Atrium: Right atrial size was normal in size.  ?Pericardium: There is no evidence of pericardial effusion.  ?Mitral Valve: The mitral valve is normal in structure. Mild mitral valve  ?regurgitation. No evidence of mitral valve stenosis.  ?Tricuspid Valve: The tricuspid valve is normal in structure. Tricuspid  ?valve regurgitation is trivial. No evidence of tricuspid stenosis.  ?Aortic Valve: The aortic valve is tricuspid. Aortic valve regurgitation is  ?trivial. No aortic stenosis is present.  ?Pulmonic Valve: The pulmonic valve was grossly normal. Pulmonic valve  ?regurgitation is mild. No evidence of pulmonic stenosis.  ?Aorta: The aortic root is normal in size and structure.  ?Venous: The inferior vena cava is normal in size with greater than 50%   ?respiratory variability, suggesting right atrial pressure of 3 mmHg.  ?IAS/Shunts: No atrial level shunt detected by color flow Doppler.  ? ? ?Cardiac monitor 03/19/21 ? ?Predominant rhythm was normal sinus rhythm with average heart rate 78bpm and ranged from 53 to 156bpm. ?Occasional PACs, atrial couplets and triplets ?Rare PVC ?  ? ?CT Calcium Score 02/23/21 ? ?Coronary arteries: Normal origins. ?  ?Coronary Calcium Score: ?  ?Left main: 0 ?  ?Left anterior descending artery: 149 ?  ?Left circumflex artery: 0 ?  ?Right coronary artery: 4.27 ?  ?Total: 153 ?  ?Percentile: 90th ?  ?Pericardium: Normal. ?  ?Ascending Aorta: Normal caliber. ?  ?Non-cardiac: [facility] ?  ?IMPRESSION: ?Coronary calcium score of 153. This was 90th percentile for age-, ?race-, and sex-matched controls. ? ? ? ?Recent Labs: ?02/05/2021: BUN 19; Creatinine, Ser 0.75; Hemoglobin 12.5; Platelets 279; Potassium 4.1; Sodium 139; TSH 2.920 ?06/04/2021: ALT 21  ?Recent Lipid Panel ?   ?Component Value Date/Time  ?  CHOL 139 06/04/2021 0718  ? TRIG 65 06/04/2021 0718  ? HDL 60 06/04/2021 0718  ? CHOLHDL 2.3 06/04/2021 0718  ? LDLCALC 66 06/04/2021 0718  ? ? ? ?Risk Assessment/Calculations:   ?  ? ? ?Physical Exam:   ? ?VS:  BP 110/78 (BP Location: Left Arm, Patient Position: Sitting, Cuff Size: Normal)   Pulse 69   Ht 5\' 4"  (1.626 m)   Wt 147 lb (66.7 kg)   SpO2 96%   BMI 25.23 kg/m?    ? ?Wt Readings from Last 3 Encounters:  ?10/09/21 147 lb (66.7 kg)  ?02/05/21 152 lb (68.9 kg)  ?11/03/20 146 lb (66.2 kg)  ?  ? ?GEN:  Well nourished, well developed in no acute distress ?HEENT: Normal ?NECK: No JVD; No carotid bruits ?CARDIAC: RRR, no murmurs, rubs, gallops ?RESPIRATORY:  Clear to auscultation without rales, wheezing or rhonchi  ?ABDOMEN: Soft, non-tender, non-distended ?MUSCULOSKELETAL:  No edema; No deformity. 2+ pedal pulses, equal bilaterally ?SKIN: Warm and dry ?NEUROLOGIC:  Alert and oriented x  3 ?PSYCHIATRIC:  Normal affect  ? ?EKG:  EKG is ordered today.  The ekg ordered today demonstrates NSR at 69 bpm, no ST/T wave abnormality, no acute change from previous ? ?Diagnoses:   ? ?1. Palpitations   ?2. Hyper

## 2021-10-09 ENCOUNTER — Ambulatory Visit (INDEPENDENT_AMBULATORY_CARE_PROVIDER_SITE_OTHER): Payer: BC Managed Care – PPO | Admitting: Nurse Practitioner

## 2021-10-09 ENCOUNTER — Encounter: Payer: Self-pay | Admitting: Nurse Practitioner

## 2021-10-09 VITALS — BP 110/78 | HR 69 | Ht 64.0 in | Wt 147.0 lb

## 2021-10-09 DIAGNOSIS — R002 Palpitations: Secondary | ICD-10-CM

## 2021-10-09 DIAGNOSIS — E785 Hyperlipidemia, unspecified: Secondary | ICD-10-CM

## 2021-10-09 DIAGNOSIS — I491 Atrial premature depolarization: Secondary | ICD-10-CM | POA: Diagnosis not present

## 2021-10-09 LAB — BASIC METABOLIC PANEL
BUN/Creatinine Ratio: 23 (ref 12–28)
BUN: 16 mg/dL (ref 8–27)
CO2: 25 mmol/L (ref 20–29)
Calcium: 9.5 mg/dL (ref 8.7–10.3)
Chloride: 103 mmol/L (ref 96–106)
Creatinine, Ser: 0.71 mg/dL (ref 0.57–1.00)
Glucose: 88 mg/dL (ref 70–99)
Potassium: 4.5 mmol/L (ref 3.5–5.2)
Sodium: 139 mmol/L (ref 134–144)
eGFR: 96 mL/min/{1.73_m2} (ref >59)

## 2021-10-09 LAB — MAGNESIUM: Magnesium: 2.2 mg/dL (ref 1.6–2.3)

## 2021-10-09 LAB — TSH: TSH: 3.64 u[IU]/mL (ref 0.450–4.500)

## 2021-10-09 MED ORDER — METOPROLOL SUCCINATE ER 25 MG PO TB24: 12.5000 mg | ORAL_TABLET | Freq: Every day | ORAL | 3 refills | Status: DC

## 2021-10-09 MED ORDER — PROPRANOLOL HCL 10 MG PO TABS: 10.0000 mg | ORAL_TABLET | Freq: Three times a day (TID) | ORAL | 11 refills | Status: DC | PRN

## 2021-10-09 NOTE — Patient Instructions (Addendum)
Medication Instructions:  ?START Toprol 12.5mg  (Break 25mg  tablet in half)  Take 1 tablet daily at bedtime ?START Propanolol 10mg  Take 1 tablet up to three times a day as needed ?*If you need a refill on your cardiac medications before your next appointment, please call your pharmacy* ? ? ?Lab Work: ?Your physician recommends that you return for lab work in: TODAY-BMET, MAG, TSH ? ?If you have labs (blood work) drawn today and your tests are completely normal, you will receive your results only by: ?MyChart Message (if you have MyChart) OR ?A paper copy in the mail ?If you have any lab test that is abnormal or we need to change your treatment, we will call you to review the results. ? ? ?Testing/Procedures: ?None Ordered ? ? ?Follow-Up: ?At [facility], you and your health needs are our priority.  As part of our continuing mission to provide you with exceptional heart care, we have created designated Provider Care Teams.  These Care Teams include your primary Cardiologist (physician) and Advanced Practice Providers (APPs -  Physician Assistants and Nurse Practitioners) who all work together to provide you with the care you need, when you need it. ? ?We recommend signing up for the patient portal called "MyChart".  Sign up information is provided on this After Visit Summary.  MyChart is used to connect with patients for Virtual Visits (Telemedicine).  Patients are able to view lab/test results, encounter notes, upcoming appointments, etc.  Non-urgent messages can be sent to your provider as well.   ?To learn more about what you can do with MyChart, go to [email or web address].   ? ?Your next appointment:   ?3 month(s) ? ?The format for your next appointment:   ?In Person ? ?Provider:   ?Christen Bame, NP      ? ? ?Other Instructions ? ? ?Important Information About Sugar ? ? ? ? ?  ?

## 2021-11-12 ENCOUNTER — Other Ambulatory Visit: Payer: Self-pay

## 2021-11-12 MED ORDER — ATORVASTATIN CALCIUM 20 MG PO TABS: 20.0000 mg | ORAL_TABLET | Freq: Every day | ORAL | 3 refills | Status: DC

## 2021-11-15 ENCOUNTER — Other Ambulatory Visit: Payer: Self-pay

## 2021-11-15 MED ORDER — ATORVASTATIN CALCIUM 20 MG PO TABS: 20.0000 mg | ORAL_TABLET | Freq: Every day | ORAL | 3 refills | Status: DC

## 2022-01-05 IMAGING — CT CT CARDIAC CORONARY ARTERY CALCIUM SCORE
3 series · 14 of 20 positions shown, 16 images · non-contrast
Comparison: None.
COMPARISON: None.

Addendum:
EXAM:
OVER-READ INTERPRETATION  CT CHEST

The following report is an over-read performed by radiologist Dr.
Sourabh Henkel [REDACTED] on 02/22/2021. This
over-read does not include interpretation of cardiac or coronary
anatomy or pathology. The coronary calcium score interpretation by
the cardiologist is attached.
CLINICAL DATA: Cardiovascular Disease Risk stratification
Coronary Calcium Score
TECHNIQUE: A gated, non-contrast computed tomography scan of the heart was
performed using 3mm slice thickness. Axial images were analyzed on a
dedicated workstation. Calcium scoring of the coronary arteries was
performed using the Agatston method.

[Series 2: cascseq 2.0 sa36 70% (id) · axial · 0.39mm/px · z∈[-197,-117]mm · 4 of 68 slices shown]
[im 14/68  vessel]
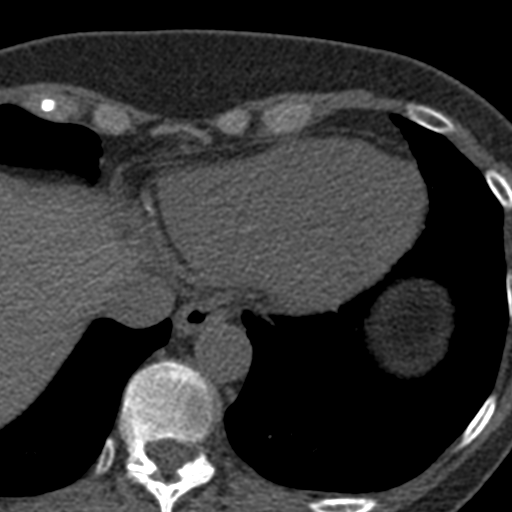
[im 27/68  vessel]
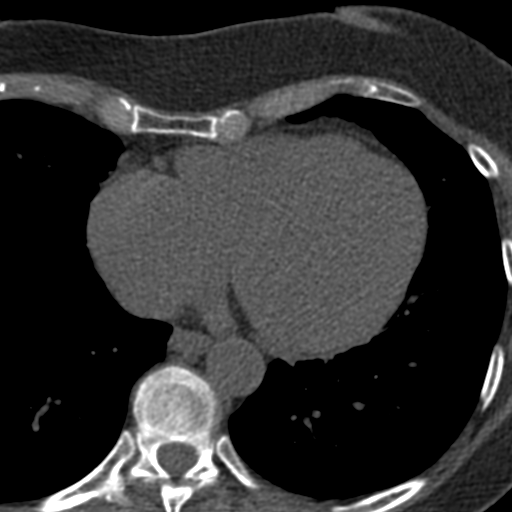
[im 41/68  vessel]
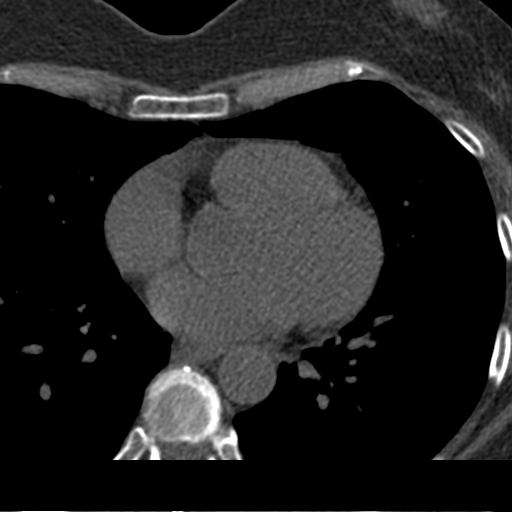
[im 54/68  vessel]
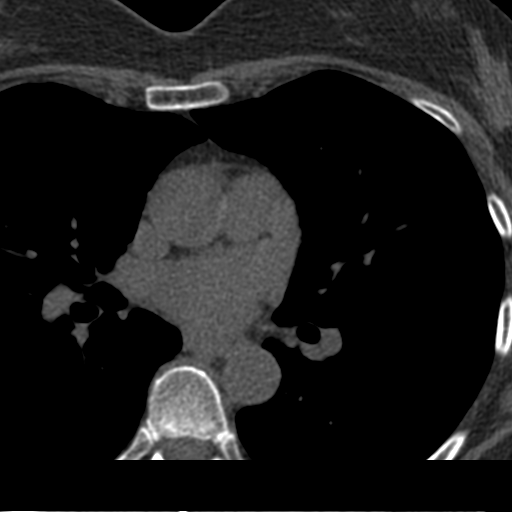

[Series 3: cascseq 2.0 bf37 st · axial · 0.69mm/px · z∈[-201,-113]mm · 5 of 68 slices shown, 7 images]
[im 12/68  vessel]
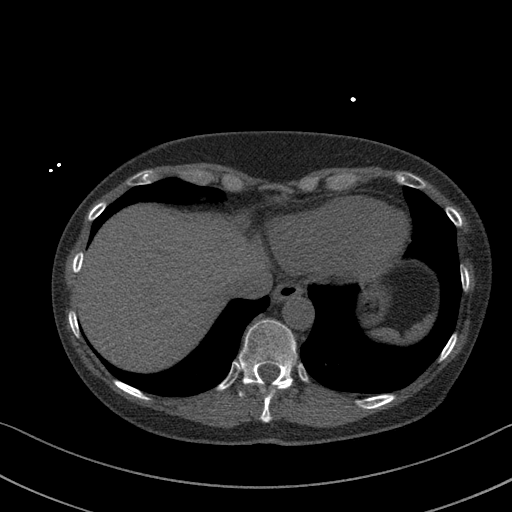
[im 12/68  lung]
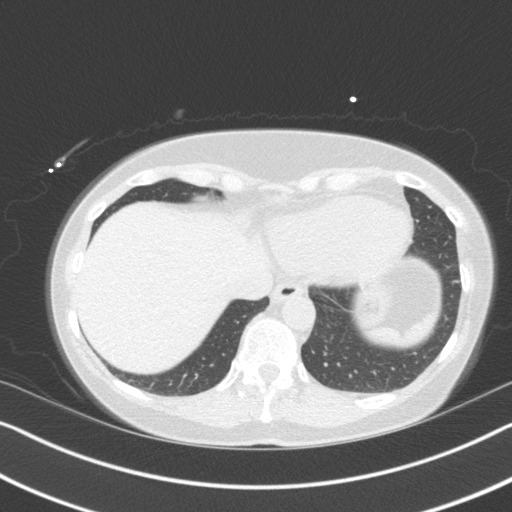
[im 23/68  vessel]
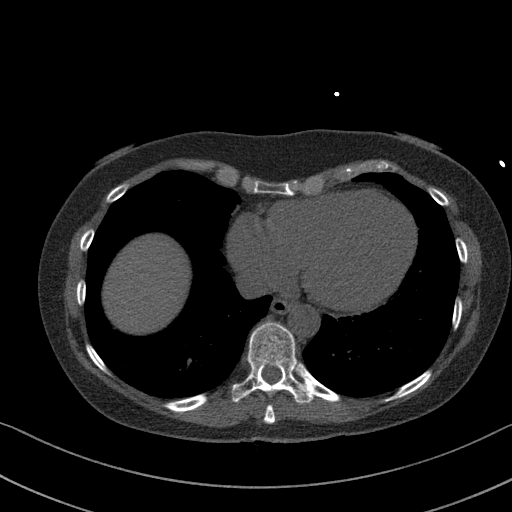
[im 34/68  vessel]
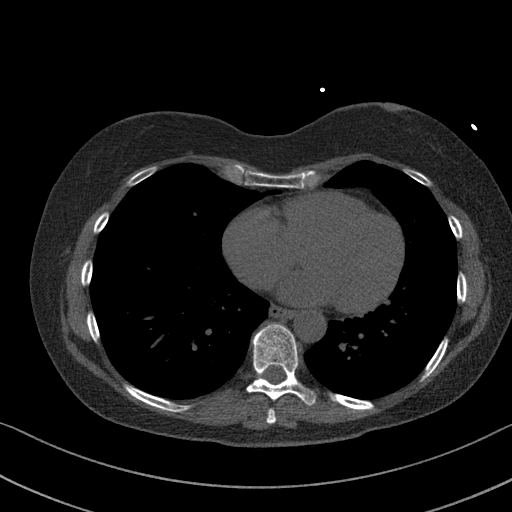
[im 45/68  vessel]
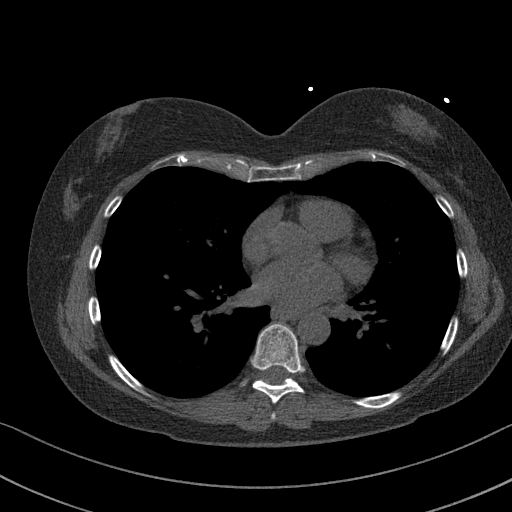
[im 56/68  vessel]
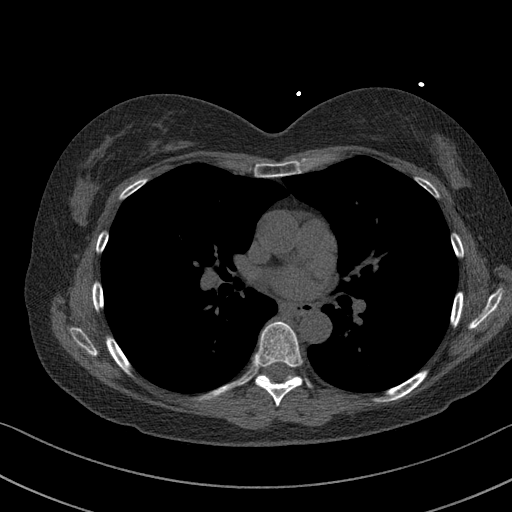
[im 56/68  lung]
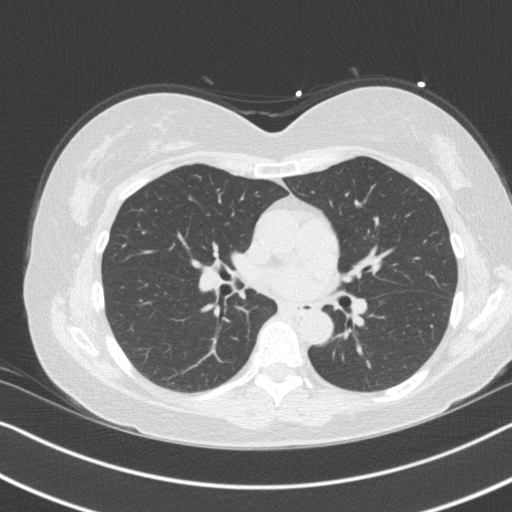

[Series 4: cascseq 2.0 br59 lung · axial · 0.69mm/px · z∈[-201,-113]mm · 5 of 68 slices shown]
[im 12/68  lung]
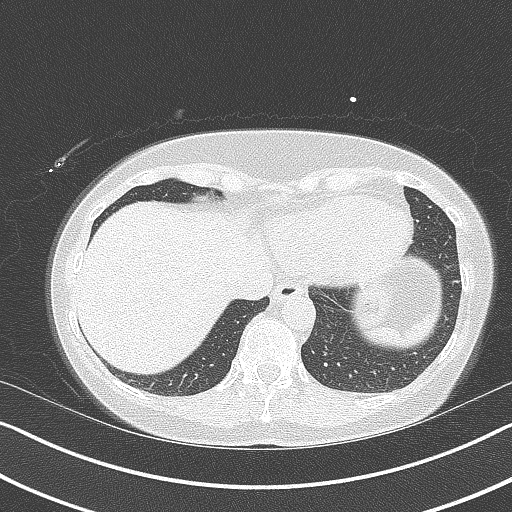
[im 23/68  lung]
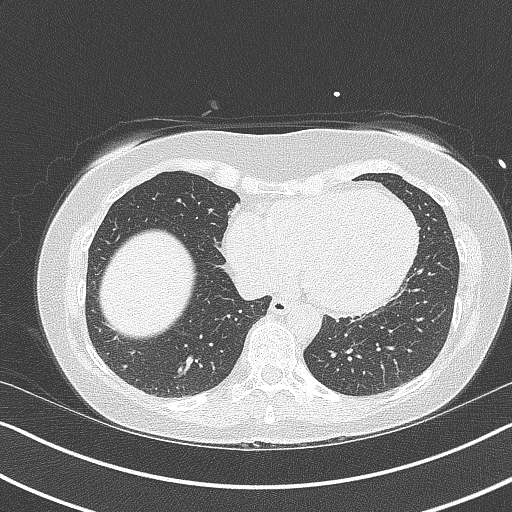
[im 34/68  lung]
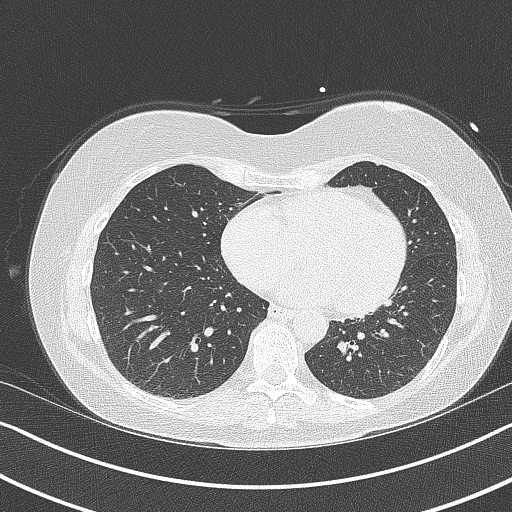
[im 45/68  lung]
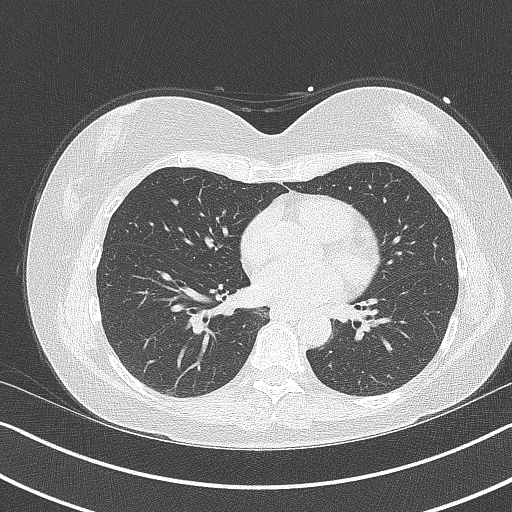
[im 56/68  lung]
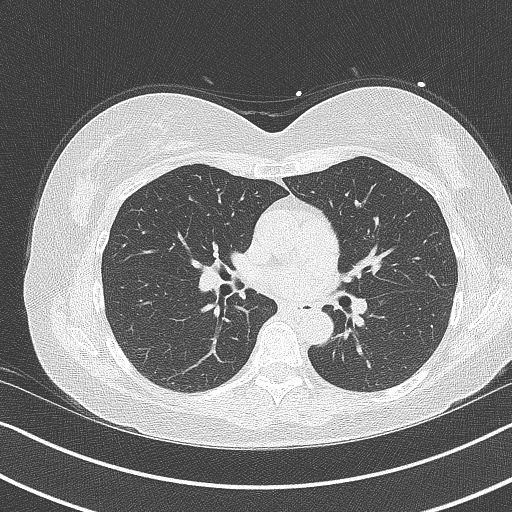

[14 of 20 positions shown; findings below may reference images not displayed]

FINDINGS: Within the visualized portions of the thorax there are no suspicious
appearing pulmonary nodules or masses, there is no acute
consolidative airspace disease, no pleural effusions, no
pneumothorax and no lymphadenopathy. Visualized portions of the
upper abdomen are unremarkable. There are no aggressive appearing
lytic or blastic lesions noted in the visualized portions of the
skeleton.
IMPRESSION: 1. No significant incidental noncardiac findings are noted.
FINDINGS: Coronary arteries: Normal origins.

Coronary Calcium Score:

Left main: 0

Left anterior descending artery: 149

Left circumflex artery: 0

Right coronary artery:

Total: 153

Percentile: 90th

Pericardium: Normal.

Ascending Aorta: Normal caliber.

Non-cardiac: See separate report from [REDACTED].
IMPRESSION: Coronary calcium score of 153. This was 90th percentile for age-,
race-, and sex-matched controls.



If CAC=0, it is reasonable to withhold statin therapy and reassess
in 5 to 10 years, as long as higher risk conditions are absent
(diabetes mellitus, family history of premature CHD in first degree
relatives (males <55 years; females <65 years), cigarette smoking,
or LDL >=190 mg/dL).

If CAC is 1 to 99, it is reasonable to initiate statin therapy for
patients >=55 years of age.

If CAC is >=100 or >=75th percentile, it is reasonable to initiate
statin therapy at any age.

Cardiology referral should be considered for patients with CAC
scores >=400 or >=75th percentile.

*3959 AHA/ACC/AACVPR/AAPA/ABC/BANU/MERTINEZ/UGO/Rallou/DINAH MARIE/MYRN/DARWIN ROLANDO
Guideline on the Management of Blood Cholesterol: A Report of the
American College of Cardiology/American Heart Association Task Force
on Clinical Practice Guidelines. J Am Coll Cardiol.
1888;73(24):8897-8367.

*** End of Addendum ***
EXAM:
OVER-READ INTERPRETATION  CT CHEST

The following report is an over-read performed by radiologist Dr.
Sourabh Henkel [REDACTED] on 02/22/2021. This
over-read does not include interpretation of cardiac or coronary
anatomy or pathology. The coronary calcium score interpretation by
the cardiologist is attached.
FINDINGS: Within the visualized portions of the thorax there are no suspicious
appearing pulmonary nodules or masses, there is no acute
consolidative airspace disease, no pleural effusions, no
pneumothorax and no lymphadenopathy. Visualized portions of the
upper abdomen are unremarkable. There are no aggressive appearing
lytic or blastic lesions noted in the visualized portions of the
skeleton.
IMPRESSION: 1. No significant incidental noncardiac findings are noted.

## 2022-01-07 NOTE — Progress Notes (Unsigned)
Cardiology Office Note:    Date:  01/09/2022   ID:  Lindsay Schmidt, DOB [birth year], MRN [id number]  PCP:  [facility] Providers Cardiologist:  Armanda Magic, MD     Referring MD: [facility]*   Chief Complaint: follow-up palpitations, lightheadedness  History of Present Illness:    Lindsay Schmidt is a very pleasant 61 y.o. female with a hx of palpitations, frequent PVCs and PACs, hyperlipidemia, mild mitral regurgitation, and elevated coronary calcium score.  Referred by PCP to cardiology for evaluation of palpitations with near-syncope and seen by Dr. Mayford Knife on 02/05/21. She described palpitations and dizziness onset after COVID infection May 2022. Described daily palpitations, "feeling off" and an episode of losing her peripheral vision lasting a second while standing. Also feeling fatigued and entire body feels heavy. Stress echo 2019 was normal. EKG in ER 11/03/20 revealed NSR. Coronary calcium score was 153, 90th percentile for age/race/sex matched controls. Cardiac monitor revealed predominant sinus rhythm, occasional PACs, atrial couplets an triplets, rare PVC. Echo revealed normal LVEF 55% and mild mitral regurgitation.   She called on-call provider on 09/27/21 with c/o palpitations. She reported she had an episode of sudden palpitations and became dizzy and light-headed. She did not have any way to monitor heart rate or rhythm such as BP cuff or smart watch. ED precautions were advised and follow-up appointment was scheduled.  She was last seen in our office on 10/09/21 by me. She reported on 09/27/2021 she was in her kitchen when she began to feel her chest pain pounding as well as a funny feeling in her head like she might pass out. Her body felt limp. She was able to lean against her counter, drink some water and did not pass out. History of PVCs from many years ago before moving to [place]. Told by another provider she had frequent PVCs. Underwent  nuclear stress test which was normal in 2019. Is an avid walker and avoids caffeine. Stays well hydrated with water. Had less than 5 events of palpitations between Christmas and first of April, episode 4/6 was worst in a long time. Feels more palpitations when she is stressed and with lack of sleep. She denies chest pain, shortness of breath, lower extremity edema, fatigue, diaphoresis, weakness, presyncope, syncope, orthopnea, and PND. Due to soft BP, she was started on Toprol XL 12.5 mg each evening. Plan to monitor BP and symptoms for potential increase in dose of BB.   Today, she is here alone for follow-up and reports she is feeling well. Filled both Toprol and propranolol prescriptions but did not start the Toprol daily.  She has not taken any doses of propanolol either. Occasionally has episodes of multiple palpitations close together. On 6/24 had a "run" of palpitations just prior to having guests come over and considered taking propranolol but was concerned about medication side effects so she did not take it.  She has had no recurrence of extended period of palpitations since that time. She denies chest pain, shortness of breath, lower extremity edema, fatigue, diaphoresis, weakness, presyncope, syncope, orthopnea, and PND. Has edema around right ankle only - no history of injury that she is aware of.    Past Medical History:  Diagnosis Date   Agatston coronary artery calcium score between 100 and 400    COVID    HLD (hyperlipidemia)    LDL goal < 70   Palpitations     Past Surgical History:  Procedure  Laterality Date   nonsustained atrial tachycardia     PVCs      Current Medications: Current Meds  Medication Sig   atorvastatin (LIPITOR) 20 MG tablet Take 1 tablet (20 mg total) by mouth daily.   propranolol (INDERAL) 10 MG tablet Take 1 tablet (10 mg total) by mouth 3 (three) times daily as needed.     Allergies:   Patient has no known allergies.   Social History    Socioeconomic History   Marital status: Married    Spouse name: Not on file   Number of children: Not on file   Years of education: Not on file   Highest education level: Not on file  Occupational History   Not on file  Tobacco Use   Smoking status: Never   Smokeless tobacco: Never  Vaping Use   Vaping Use: Never used  Substance and Sexual Activity   Alcohol use: Yes    Alcohol/week: 0.0 - 5.0 standard drinks of alcohol   Drug use: No   Sexual activity: Yes  Other Topics Concern   Not on file  Social History Narrative   Not on file   Social Determinants of Health   Financial Resource Strain: Not on file  Food Insecurity: Not on file  Transportation Needs: Not on file  Physical Activity: Not on file  Stress: Not on file  Social Connections: Not on file     Family History: The patient's family history includes Coronary artery disease in her brother.  ROS:   Please see the history of present illness.   +palpitations All other systems reviewed and are negative.  Labs/Other Studies Reviewed:    The following studies were reviewed today:  Echo 04/09/21  Left Ventricle: Left ventricular ejection fraction by 3D volume is 55 %.  The left ventricle has normal function. The left ventricle has no regional  wall motion abnormalities. The average left ventricular global  longitudinal strain is -19.6 %. The global   longitudinal strain is normal. The left ventricular internal cavity size  was normal in size. There is no left ventricular hypertrophy. Left  ventricular diastolic parameters were normal.  Right Ventricle: The right ventricular size is normal. No increase in  right ventricular wall thickness. Right ventricular systolic function is  normal. There is normal pulmonary artery systolic pressure. The tricuspid  regurgitant velocity is 1.94 m/s, and   with an assumed right atrial pressure of 3 mmHg, the estimated right  ventricular systolic pressure is 18.1 mmHg.   Left Atrium: Left atrial size was normal in size.  Right Atrium: Right atrial size was normal in size.  Pericardium: There is no evidence of pericardial effusion.  Mitral Valve: The mitral valve is normal in structure. Mild mitral valve  regurgitation. No evidence of mitral valve stenosis.  Tricuspid Valve: The tricuspid valve is normal in structure. Tricuspid  valve regurgitation is trivial. No evidence of tricuspid stenosis.  Aortic Valve: The aortic valve is tricuspid. Aortic valve regurgitation is  trivial. No aortic stenosis is present.  Pulmonic Valve: The pulmonic valve was grossly normal. Pulmonic valve  regurgitation is mild. No evidence of pulmonic stenosis.  Aorta: The aortic root is normal in size and structure.  Venous: The inferior vena cava is normal in size with greater than 50%  respiratory variability, suggesting right atrial pressure of 3 mmHg.  IAS/Shunts: No atrial level shunt detected by color flow Doppler.    Cardiac monitor 03/19/21  Predominant rhythm was normal sinus rhythm with  average heart rate 78bpm and ranged from 53 to 156bpm. Occasional PACs, atrial couplets and triplets Rare PVC    CT Calcium Score 02/23/21  Coronary arteries: Normal origins.   Coronary Calcium Score:   Left main: 0   Left anterior descending artery: 149   Left circumflex artery: 0   Right coronary artery: 4.27   Total: 153   Percentile: 90th   Pericardium: Normal.   Ascending Aorta: Normal caliber.   Non-cardiac: See separate report from [facility].   IMPRESSION: Coronary calcium score of 153. This was 90th percentile for age-, race-, and sex-matched controls.    Recent Labs: 02/05/2021: Hemoglobin 12.5; Platelets 279 06/04/2021: ALT 21 10/09/2021: BUN 16; Creatinine, Ser 0.71; Magnesium 2.2; Potassium 4.5; Sodium 139; TSH 3.640  Recent Lipid Panel    Component Value Date/Time   CHOL 139 06/04/2021 0718   TRIG 65 06/04/2021 0718   HDL 60  06/04/2021 0718   CHOLHDL 2.3 06/04/2021 0718   LDLCALC 66 06/04/2021 0718     Risk Assessment/Calculations:       Physical Exam:    VS:  BP 130/90   Pulse 67   Ht 5\' 4"  (1.626 m)   Wt 146 lb 3.2 oz (66.3 kg)   SpO2 98%   BMI 25.10 kg/m     Wt Readings from Last 3 Encounters:  01/09/22 146 lb 3.2 oz (66.3 kg)  10/09/21 147 lb (66.7 kg)  02/05/21 152 lb (68.9 kg)     GEN:  Well nourished, well developed in no acute distress HEENT: Normal NECK: No JVD; No carotid bruits CARDIAC: RRR, no murmurs, rubs, gallops RESPIRATORY:  Clear to auscultation without rales, wheezing or rhonchi  ABDOMEN: Soft, non-tender, non-distended MUSCULOSKELETAL:  Mild ankle edema RLE, no edema LLE. No deformity. 2+ pedal pulses, equal bilaterally SKIN: Warm and dry NEUROLOGIC:  Alert and oriented x 3 PSYCHIATRIC:  Normal affect   EKG:  EKG is ordered today.  The ekg ordered today demonstrates NSR at 69 bpm, no ST/T wave abnormality, no acute change from previous  Diagnoses:    1. Palpitations   2. Premature atrial contraction   3. Hyperlipidemia LDL goal <70     Assessment and Plan:     Palpitations/Frequent PACs: Had an episode of presyncope and severe palpitations 09/27/21. Cardiac monitor 02/2021 revealed frequent PACs.  Occasionally feels multiple palpitations close together. One recent episode of extended palpitations, no presyncope or syncope, resolved on its own.  She will consider taking Toprol daily in the future if occurrence becomes more frequent.  Additionally, has propanolol to take as needed for palpitations. We discussed symptoms she should report prior to next office visit.   Hyperlipidemia LDL goal < 70: LDL 66 05/2021.  Continue regular physical activity and heart healthy diet.  Continue atorvastatin.   Mild mitral regurgitation: Mild MR, normal LVEF on echo 03/2021. Occasional palpitations, no chest pain, presyncope, syncope, dyspnea. We discussed symptoms she should report  prior to next office visit. Will repeat echocardiogram in 2-5 years, sooner if clinically indication.    Disposition: 1 year with Dr. 04/2021   Medication Adjustments/Labs and Tests Ordered: Current medicines are reviewed at length with the patient today.  Concerns regarding medicines are outlined above.  No orders of the defined types were placed in this encounter.  No orders of the defined types were placed in this encounter.   Patient Instructions  Medication Instructions:   Your physician recommends that you continue on your current medications as directed.  Please refer to the Current Medication list given to you today.   *If you need a refill on your cardiac medications before your next appointment, please call your pharmacy*   Lab Work:  None ordered.  If you have labs (blood work) drawn today and your tests are completely normal, you will receive your results only by: MyChart Message (if you have MyChart) OR A paper copy in the mail If you have any lab test that is abnormal or we need to change your treatment, we will call you to review the results.   Testing/Procedures:   None ordered.   Follow-Up: At [facility], you and your health needs are our priority.  As part of our continuing mission to provide you with exceptional heart care, we have created designated Provider Care Teams.  These Care Teams include your primary Cardiologist (physician) and Advanced Practice Providers (APPs -  Physician Assistants and Nurse Practitioners) who all work together to provide you with the care you need, when you need it.  We recommend signing up for the patient portal called "MyChart".  Sign up information is provided on this After Visit Summary.  MyChart is used to connect with patients for Virtual Visits (Telemedicine).  Patients are able to view lab/test results, encounter notes, upcoming appointments, etc.  Non-urgent messages can be sent to your provider as well.   To learn  more about what you can do with MyChart, go to [email or web address].    Your next appointment:   1 year(s)  The format for your next appointment:   In Person  Provider:   Armanda Magic, MD     Other Instructions  Your physician wants you to follow-up in: 1 year with Dr. Mayford Knife.  You will receive a reminder letter in the mail two months in advance. If you don't receive a letter, please call our office to schedule the follow-up appointment.    Important Information About Sugar         Signed, Levi Aland, NP  01/09/2022 12:15 PM    [facility]

## 2022-01-09 ENCOUNTER — Ambulatory Visit (INDEPENDENT_AMBULATORY_CARE_PROVIDER_SITE_OTHER): Payer: BC Managed Care – PPO | Admitting: Nurse Practitioner

## 2022-01-09 ENCOUNTER — Encounter: Payer: Self-pay | Admitting: Nurse Practitioner

## 2022-01-09 VITALS — BP 130/90 | HR 67 | Ht 64.0 in | Wt 146.2 lb

## 2022-01-09 DIAGNOSIS — R002 Palpitations: Secondary | ICD-10-CM | POA: Diagnosis not present

## 2022-01-09 DIAGNOSIS — E785 Hyperlipidemia, unspecified: Secondary | ICD-10-CM | POA: Diagnosis not present

## 2022-01-09 DIAGNOSIS — I340 Nonrheumatic mitral (valve) insufficiency: Secondary | ICD-10-CM | POA: Diagnosis not present

## 2022-01-09 DIAGNOSIS — I491 Atrial premature depolarization: Secondary | ICD-10-CM | POA: Diagnosis not present

## 2022-01-09 NOTE — Patient Instructions (Signed)
Medication Instructions:   Your physician recommends that you continue on your current medications as directed. Please refer to the Current Medication list given to you today.   *If you need a refill on your cardiac medications before your next appointment, please call your pharmacy*   Lab Work:  None ordered.  If you have labs (blood work) drawn today and your tests are completely normal, you will receive your results only by: MyChart Message (if you have MyChart) OR A paper copy in the mail If you have any lab test that is abnormal or we need to change your treatment, we will call you to review the results.   Testing/Procedures:   None ordered.   Follow-Up: At [facility], you and your health needs are our priority.  As part of our continuing mission to provide you with exceptional heart care, we have created designated Provider Care Teams.  These Care Teams include your primary Cardiologist (physician) and Advanced Practice Providers (APPs -  Physician Assistants and Nurse Practitioners) who all work together to provide you with the care you need, when you need it.  We recommend signing up for the patient portal called "MyChart".  Sign up information is provided on this After Visit Summary.  MyChart is used to connect with patients for Virtual Visits (Telemedicine).  Patients are able to view lab/test results, encounter notes, upcoming appointments, etc.  Non-urgent messages can be sent to your provider as well.   To learn more about what you can do with MyChart, go to [email or web address].    Your next appointment:   1 year(s)  The format for your next appointment:   In Person  Provider:   Armanda Magic, MD     Other Instructions  Your physician wants you to follow-up in: 1 year with Dr. Mayford Knife.  You will receive a reminder letter in the mail two months in advance. If you don't receive a letter, please call our office to schedule the follow-up  appointment.    Important Information About Sugar

## 2022-01-31 DIAGNOSIS — L821 Other seborrheic keratosis: Secondary | ICD-10-CM | POA: Diagnosis not present

## 2022-04-08 DIAGNOSIS — L578 Other skin changes due to chronic exposure to nonionizing radiation: Secondary | ICD-10-CM | POA: Diagnosis not present

## 2022-04-08 DIAGNOSIS — L570 Actinic keratosis: Secondary | ICD-10-CM | POA: Diagnosis not present

## 2022-04-08 DIAGNOSIS — L814 Other melanin hyperpigmentation: Secondary | ICD-10-CM | POA: Diagnosis not present

## 2022-04-08 DIAGNOSIS — L821 Other seborrheic keratosis: Secondary | ICD-10-CM | POA: Diagnosis not present

## 2022-04-23 DIAGNOSIS — U071 COVID-19: Secondary | ICD-10-CM | POA: Diagnosis not present

## 2022-04-29 DIAGNOSIS — R300 Dysuria: Secondary | ICD-10-CM | POA: Diagnosis not present

## 2022-05-30 DIAGNOSIS — H6993 Unspecified Eustachian tube disorder, bilateral: Secondary | ICD-10-CM | POA: Diagnosis not present

## 2022-05-30 DIAGNOSIS — H9313 Tinnitus, bilateral: Secondary | ICD-10-CM | POA: Diagnosis not present

## 2022-05-30 DIAGNOSIS — J342 Deviated nasal septum: Secondary | ICD-10-CM | POA: Diagnosis not present

## 2022-05-30 DIAGNOSIS — H903 Sensorineural hearing loss, bilateral: Secondary | ICD-10-CM | POA: Diagnosis not present

## 2022-06-12 DIAGNOSIS — R7303 Prediabetes: Secondary | ICD-10-CM | POA: Diagnosis not present

## 2022-06-12 DIAGNOSIS — Z23 Encounter for immunization: Secondary | ICD-10-CM | POA: Diagnosis not present

## 2022-06-12 DIAGNOSIS — E782 Mixed hyperlipidemia: Secondary | ICD-10-CM | POA: Diagnosis not present

## 2022-06-12 DIAGNOSIS — Z87898 Personal history of other specified conditions: Secondary | ICD-10-CM | POA: Diagnosis not present

## 2022-06-12 DIAGNOSIS — Z0000 Encounter for general adult medical examination without abnormal findings: Secondary | ICD-10-CM | POA: Diagnosis not present

## 2022-07-12 DIAGNOSIS — Z01419 Encounter for gynecological examination (general) (routine) without abnormal findings: Secondary | ICD-10-CM | POA: Diagnosis not present

## 2022-07-31 DIAGNOSIS — Z1231 Encounter for screening mammogram for malignant neoplasm of breast: Secondary | ICD-10-CM | POA: Diagnosis not present

## 2022-10-21 DIAGNOSIS — L821 Other seborrheic keratosis: Secondary | ICD-10-CM | POA: Diagnosis not present

## 2022-11-25 ENCOUNTER — Other Ambulatory Visit: Payer: Self-pay | Admitting: Cardiology

## 2022-11-29 ENCOUNTER — Other Ambulatory Visit: Payer: Self-pay | Admitting: *Deleted

## 2022-11-29 MED ORDER — ATORVASTATIN CALCIUM 20 MG PO TABS: 20.0000 mg | ORAL_TABLET | Freq: Every day | ORAL | 0 refills | Status: DC

## 2022-12-10 DIAGNOSIS — J0100 Acute maxillary sinusitis, unspecified: Secondary | ICD-10-CM | POA: Diagnosis not present

## 2023-01-06 ENCOUNTER — Telehealth: Payer: Self-pay | Admitting: Cardiology

## 2023-01-06 NOTE — Telephone Encounter (Signed)
Called patient to discuss her report of near syncopal symptoms yesterday 01/05/23. Patient reports that this episode of near-syncope was similar to episode she had 15 months and 2 visits ago with Benjamine Sprague NP. She states both times she experienced brief palpitations before her peripheral vision got dark and and she started to feel like she was going to pass out.  She states that yesterday she was out for a walk and started to feel palpitations and near syncope when she ended her walk at 10 am. She was able to drink water and take her propanolol and started to feel better. She checked her HR 30 mins later and it was 111 at rest. 3 hours later when she was feeling better her HR was 72.   Patient due for 1 yr follow up, scheduled patient for 01/30/23. Patient is requesting a refill of propanolol as her current supply is expired. She is also asking for a refill of toprol even though she states she has never taken it. She does not check her BP at home. Forwarded to Dr. Mayford Knife for advice.

## 2023-01-06 NOTE — Telephone Encounter (Signed)
Patient c/o Palpitations:  High priority if patient c/o lightheadedness, shortness of breath, or chest pain  How long have you had palpitations/irregular HR/ Afib? Are you having the symptoms now? No, Happened yesterday  Are you currently experiencing lightheadedness, SOB or CP? No  Do you have a history of afib (atrial fibrillation) or irregular heart rhythm? Yes  Have you checked your BP or HR? (document readings if available): No  Are you experiencing any other symptoms? Patient was sitting down yesterday when her heart began to have fast palpitations. Patient stated that she began to feel lightheaded and felt like everything was getting dark. Patient stated she felt like she was going to pass out, but did not. Please advise.

## 2023-01-06 NOTE — Telephone Encounter (Signed)
Patient states yesterday she had an episode where she had palpitations and felt "things were getting dark like I was about to pass out." She did not pass out. She took a propranolol and did not have any further issues. Denies CP.  She states her current Rx of propranolol expired in April and she would like to schedule her 1 year F/U appt with Dr. Mayford Knife to get more refills.  Will forward to Dr. Norris Cross nurse to assist with getting her scheduled for an office visit.

## 2023-01-08 NOTE — Telephone Encounter (Signed)
Called to discuss heat precautions with patient and to schedule appt w/ APP for BP check. Advised patient to track BP's until that appt. Patient agrees to plan. Appt booked for 01/22/23.

## 2023-01-14 NOTE — Telephone Encounter (Signed)
Called patient to let her know appt 01/22/23 at 2:45 is confirmed.

## 2023-01-15 ENCOUNTER — Encounter: Payer: Self-pay | Admitting: Cardiology

## 2023-01-15 ENCOUNTER — Telehealth: Payer: Self-pay

## 2023-01-15 NOTE — Telephone Encounter (Signed)
Call to patient to discuss symptoms. She reports pressure/grabbing sensation in left lower breast/rib area in left mid chest. She denies SOB or chest pain but states "it is a strange feeling that occurs and lasts around 7 to 10 seconds each time". She acknowledges she has experienced this sensation for the past 8 months but it seems to be more frequent recently, though it does not stop her from performing any activities of daily living. She reports she is able to exercise normally on her treadmill. She denies any lightheadedness or near-syncope with this episode. Patient has metoprolol ordered but states she has never taken it, she also has propanolol ordered for palpitations but script is expired.   Patient has an appt w/ APP on 7/31 to have orthostatics checked as she called in 01/06/23 to report palpitations and near-syncopal experience while exercising in hot weather. APP had opening on 7/29 so I moved her appt to 7/29 and updated notes so patient could be seen for both issues. Reviewed ED precautions, patient verbalizes understanding to call 911 if palpitations, chest pressure or SOB occur and do not get better with rest. I also advised her to call 911 if she passes out.

## 2023-01-16 MED ORDER — PROPRANOLOL HCL 10 MG PO TABS: 10.0000 mg | ORAL_TABLET | Freq: Three times a day (TID) | ORAL | 11 refills | Status: AC | PRN

## 2023-01-16 NOTE — Telephone Encounter (Signed)
Refill for propanolol has been sent in. Called patient and made her aware.

## 2023-01-16 NOTE — Addendum Note (Signed)
Addended by: Frutoso Schatz on: 01/16/2023 12:15 PM   Modules accepted: Orders

## 2023-01-19 NOTE — Progress Notes (Unsigned)
Cardiology Office Note:  .   Date:  01/20/2023  ID:  Lindsay Schmidt, DOB Jun 16, 1961, MRN [id number] PCP: [facility] Providers Cardiologist:  Armanda Magic, MD {  History of Present Illness: .   Lindsay Schmidt is a 62 y.o. female with a past medical history of palpitations, frequent PVCs and PACs, hyperlipidemia, mild mitral regurgitation, elevated coronary calcium score here for follow-up appointment.  Referred by PCP for cardiology evaluation of palpitations with near syncope.  Seen by Dr. Mayford Knife on 02/05/2021.  Described palpitations and dizziness onset after COVID infection May 2022.  Describes daily palpitations, "feeling off" and episode of losing her peripheral vision lasting a second while standing.  Also, feeling fatigued and entire body feels heavy.  Stress echo 2019 was normal.  EKG in the ER 11/03/2020 revealed normal sinus rhythm.  Coronary calcium score was 153, 90 percentile for age/race/sex matched controls.  Cardiac monitor revealed predominantly normal sinus rhythm, occasional PACs, atrial couplets and triplets, rare PVC.  Echo revealed normal LVEF 55% and mild mitral regurgitation.  She called the on-call provider/6/23 with complaint of palpitations.  Reported she had an episode of sudden palpitations and became dizzy and lightheaded.  ED precautions were advised and follow-up appointment scheduled.  She was seen 10/09/2021 and had reported on 4/6 she was in her kitchen when she began to feel some chest pain and pounding as well as a funny feeling in her head like she might pass out.  Body felt limp.  Was able to lean against a wall and drink some water.  No syncope.  Stays well-hydrated.  Has less than 5 events palpitation between Christmas and 1 April, episode 4/6 was worse when she sat in a long time.  Feeling more palpitations when she is stressed with lack of sleep.  Started on metoprolol succinate 12.5 mg each evening.  Plan to monitor blood  pressure and symptoms.  Was seen 12/2021 and was taking both metoprolol and propranolol.  Occasionally has episodes of multiple palpitations close together.  On 6/24 had a "run" of palpitations just prior to having gas, over and consider taking propranolol but was concerned about medication side effects.  No recurrence of extended palpitations since that time.  No chest pain, shortness of breath, lower extremity edema, fatigue, diaphoresis, weakness, syncope, presyncope.  No orthopnea or PND.  Today, she shares she has had heart palpitations since age 74. Given two medications and she can start a BB if needed (metoprolol and propranolol). Rapid heart beat in the past and felt like she was going to faint. Mid morning went for a walk, ate a half of a banana. She was almost home and started palpitating and saw black, did take one of the propranolols didn't have any issues. She has some grabbing or pressure in her left chest under her breast. Might have it every couple of weeks. July its increased and now is happening 15-20 times a day. Sometimes a positional issue but other times with exertion.    Married to a nurse, lots of bypass surgery and stents in her family, strong family hx of heart related issues. Sometimes she feels a little off kilter. She is getting a little older. Chocolate and drinks 1-2 beers a night but no coffee. Exercises regularly.  Reports no shortness of breath nor dyspnea on exertion. Reports no chest pain, pressure, or tightness. No edema, orthopnea, PND.   ROS: pertinent ROS in HPI  Studies Reviewed: .  EKG Interpretation Date/Time:  Monday January 20 2023 08:54:13 EDT Ventricular Rate:  65 PR Interval:  178 QRS Duration:  78 QT Interval:  414 QTC Calculation: 430 R Axis:   82  Text Interpretation: Normal sinus rhythm Normal ECG When compared with ECG of 03-Nov-2020 13:05, PREVIOUS ECG IS PRESENT Confirmed by Jari Favre [phone]) on 01/20/2023 7:57:27 PM    The following  studies were reviewed today:   Echo 04/09/21   Left Ventricle: Left ventricular ejection fraction by 3D volume is 55 %.  The left ventricle has normal function. The left ventricle has no regional  wall motion abnormalities. The average left ventricular global  longitudinal strain is -19.6 %. The global   longitudinal strain is normal. The left ventricular internal cavity size  was normal in size. There is no left ventricular hypertrophy. Left  ventricular diastolic parameters were normal.  Right Ventricle: The right ventricular size is normal. No increase in  right ventricular wall thickness. Right ventricular systolic function is  normal. There is normal pulmonary artery systolic pressure. The tricuspid  regurgitant velocity is 1.94 m/s, and   with an assumed right atrial pressure of 3 mmHg, the estimated right  ventricular systolic pressure is 18.1 mmHg.  Left Atrium: Left atrial size was normal in size.  Right Atrium: Right atrial size was normal in size.  Pericardium: There is no evidence of pericardial effusion.  Mitral Valve: The mitral valve is normal in structure. Mild mitral valve  regurgitation. No evidence of mitral valve stenosis.  Tricuspid Valve: The tricuspid valve is normal in structure. Tricuspid  valve regurgitation is trivial. No evidence of tricuspid stenosis.  Aortic Valve: The aortic valve is tricuspid. Aortic valve regurgitation is  trivial. No aortic stenosis is present.  Pulmonic Valve: The pulmonic valve was grossly normal. Pulmonic valve  regurgitation is mild. No evidence of pulmonic stenosis.  Aorta: The aortic root is normal in size and structure.  Venous: The inferior vena cava is normal in size with greater than 50%  respiratory variability, suggesting right atrial pressure of 3 mmHg.  IAS/Shunts: No atrial level shunt detected by color flow Doppler.      Cardiac monitor 03/19/21   Predominant rhythm was normal sinus rhythm with average heart rate  78bpm and ranged from 53 to 156bpm. Occasional PACs, atrial couplets and triplets Rare PVC     CT Calcium Score 02/23/21   Coronary arteries: Normal origins.   Coronary Calcium Score:   Left main: 0   Left anterior descending artery: 149   Left circumflex artery: 0   Right coronary artery: 4.27   Total: 153   Percentile: 90th   Pericardium: Normal.   Ascending Aorta: Normal caliber.   Non-cardiac: See separate report from [facility].   IMPRESSION: Coronary calcium score of 153. This was 90th percentile for age-, race-, and sex-matched controls.      Physical Exam:   VS:  BP 121/76   Pulse 65   Ht 5\' 4"  (1.626 m)   Wt 147 lb 3.2 oz (66.8 kg)   SpO2 98%   BMI 25.27 kg/m    Wt Readings from Last 3 Encounters:  01/20/23 147 lb 3.2 oz (66.8 kg)  01/09/22 146 lb 3.2 oz (66.3 kg)  10/09/21 147 lb (66.7 kg)    GEN: Well nourished, well developed in no acute distress NECK: No JVD; No carotid bruits CARDIAC: RRR, no murmurs, rubs, gallops RESPIRATORY:  Clear to auscultation without rales, wheezing or rhonchi  ABDOMEN:  Soft, non-tender, non-distended EXTREMITIES:  No edema; No deformity   ASSESSMENT AND PLAN: .   1.  Palpitations/frequent PACs/chest pain -will order zio monitor -will order coronary CTA for further workuo given strong family hx -continue current medications and restart on metoprolol succinate 12.5mg  daily -watch BP and HR  2.  Hyperlipidemia, LDL goal less than 70 -continue lipitor 20mg  daily  3.  Mild mitral regurgitation -echo from 2022 reviewed      Dispo: she can follow-up in 6 weeks   Signed, Sharlene Dory, PA-C

## 2023-01-20 ENCOUNTER — Encounter: Payer: Self-pay | Admitting: Physician Assistant

## 2023-01-20 ENCOUNTER — Ambulatory Visit (INDEPENDENT_AMBULATORY_CARE_PROVIDER_SITE_OTHER): Payer: BC Managed Care – PPO

## 2023-01-20 ENCOUNTER — Ambulatory Visit: Payer: BC Managed Care – PPO | Attending: Physician Assistant | Admitting: Physician Assistant

## 2023-01-20 VITALS — BP 121/76 | HR 65 | Ht 64.0 in | Wt 147.2 lb

## 2023-01-20 DIAGNOSIS — E785 Hyperlipidemia, unspecified: Secondary | ICD-10-CM

## 2023-01-20 DIAGNOSIS — R002 Palpitations: Secondary | ICD-10-CM | POA: Diagnosis not present

## 2023-01-20 DIAGNOSIS — I491 Atrial premature depolarization: Secondary | ICD-10-CM

## 2023-01-20 DIAGNOSIS — R079 Chest pain, unspecified: Secondary | ICD-10-CM

## 2023-01-20 DIAGNOSIS — I340 Nonrheumatic mitral (valve) insufficiency: Secondary | ICD-10-CM

## 2023-01-20 MED ORDER — METOPROLOL TARTRATE 50 MG PO TABS: 50.0000 mg | ORAL_TABLET | Freq: Two times a day (BID) | ORAL | 0 refills | Status: DC

## 2023-01-20 MED ORDER — METOPROLOL SUCCINATE ER 25 MG PO TB24: 12.5000 mg | ORAL_TABLET | Freq: Every day | ORAL | 3 refills | Status: DC

## 2023-01-20 NOTE — Patient Instructions (Signed)
Medication Instructions:  Your physician has recommended you make the following change in your medication: START Metoprolol Succinate 25mg  take 1/2 (one half) tablet by mouth daily.   *If you need a refill on your cardiac medications before your next appointment, please call your pharmacy*   Lab Work: CMET, Magnesium, and CBC today.  If you have labs (blood work) drawn today and your tests are completely normal, you will receive your results only by: MyChart Message (if you have MyChart) OR A paper copy in the mail If you have any lab test that is abnormal or we need to change your treatment, we will call you to review the results.   Testing/Procedures:   Your cardiac CT will be scheduled at:   [facility] [address] [place], [place] [address] [phone]  Please arrive at the [facility] and Children's Entrance (Entrance C2) of [facility] 30 minutes prior to test start time. You can use the FREE valet parking offered at entrance C (encouraged to control the heart rate for the test)  Proceed to the [facility] Radiology Department (first floor) to check-in and test prep.  All radiology patients and guests should use entrance C2 at [facility], accessed from [facility], even though the hospital's physical address listed is [address].    I  Please follow these instructions carefully (unless otherwise directed):  An IV will be required for this test and Nitroglycerin will be given.  On the Night Before the Test: Be sure to Drink plenty of water. Do not consume any caffeinated/decaffeinated beverages or chocolate 12 hours prior to your test. Do not take any antihistamines 12 hours prior to your test.  On the Day of the Test: Drink plenty of water until 1 hour prior to the test. Do not eat any food 1 hour prior to test. You may take your regular medications prior to the test.  Take metoprolol tartrate(Lopressor) 50mg   two hours prior to test. FEMALES- please wear underwire-free bra if available, avoid dresses & tight clothing       After the Test: Drink plenty of water. After receiving IV contrast, you may experience a mild flushed feeling. This is normal. On occasion, you may experience a mild rash up to 24 hours after the test. This is not dangerous. If this occurs, you can take Benadryl 25 mg and increase your fluid intake. If you experience trouble breathing, this can be serious. If it is severe call 911 IMMEDIATELY. If it is mild, please call our office.  We will call to schedule your test 2-4 weeks out understanding that some insurance companies will need an authorization prior to the service being performed.   For more information and frequently asked questions, please visit our website : [email or web address]  For non-scheduling related questions, please contact the cardiac imaging nurse navigator should you have any questions/concerns: Cardiac Imaging Nurse Navigators Direct Office Dial: [phone]   For scheduling needs, including cancellations and rescheduling, please call [place], [phone].   ZIO XT- Long Term Monitor Instructions  Your physician has requested you wear a ZIO patch monitor for 3 days.  This is a single patch monitor. [employer] supplies one patch monitor per enrollment. Additional stickers are not available. Please do not apply patch if you will be having a Nuclear Stress Test,  Echocardiogram, Cardiac CT, MRI, or Chest Xray during the period you would be wearing the  monitor. The patch cannot be worn during these tests. You  cannot remove and re-apply the  ZIO XT patch monitor.  Your ZIO patch monitor will be mailed 3 day USPS to your address on file. It may take 3-5 days  to receive your monitor after you have been enrolled.  Once you have received your monitor, please review the enclosed instructions. Your monitor  has already been registered assigning a  specific monitor serial # to you.  Billing and Patient Assistance Program Information  We have supplied [employer] with any of your insurance information on file for billing purposes. [employer] offers a sliding scale Patient Assistance Program for patients that do not have  insurance, or whose insurance does not completely cover the cost of the ZIO monitor.  You must apply for the Patient Assistance Program to qualify for this discounted rate.  To apply, please call [employer] at [phone], select option 4, select option 2, ask to apply for  Patient Assistance Program. Meredeth Ide will ask your household income, and how many people  are in your household. They will quote your out-of-pocket cost based on that information.  [employer] will also be able to set up a 60-month, interest-free payment plan if needed.  Applying the monitor   Shave hair from upper left chest.  Hold abrader disc by orange tab. Rub abrader in 40 strokes over the upper left chest as  indicated in your monitor instructions.  Clean area with 4 enclosed alcohol pads. Let dry.  Apply patch as indicated in monitor instructions. Patch will be placed under collarbone on left  side of chest with arrow pointing upward.  Rub patch adhesive wings for 2 minutes. Remove white label marked "1". Remove the white  label marked "2". Rub patch adhesive wings for 2 additional minutes.  While looking in a mirror, press and release button in center of patch. A small green light will  flash 3-4 times. This will be your only indicator that the monitor has been turned on.  Do not shower for the first 24 hours. You may shower after the first 24 hours.  Press the button if you feel a symptom. You will hear a small click. Record Date, Time and  Symptom in the Patient Logbook.  When you are ready to remove the patch, follow instructions on the last 2 pages of Patient  Logbook. Stick patch monitor onto the last page of Patient Logbook.  Place Patient  Logbook in the blue and white box. Use locking tab on box and tape box closed  securely. The blue and white box has prepaid postage on it. Please place it in the mailbox as  soon as possible. Your physician should have your test results approximately 7 days after the  monitor has been mailed back to [facility].  Call [facility] Customer Care at [phone] if you have questions regarding  your ZIO XT patch monitor. Call them immediately if you see an orange light blinking on your  monitor.  If your monitor falls off in less than 4 days, contact our Monitor department at [phone].  If your monitor becomes loose or falls off after 4 days call [employer] at [phone] for  suggestions on securing your monitor   Follow-Up: At [facility], you and your health needs are our priority.  As part of our continuing mission to provide you with exceptional heart care, we have created designated Provider Care Teams.  These Care Teams include your primary Cardiologist (physician) and Advanced Practice Providers (APPs -  Physician Assistants and Nurse Practitioners) who all  work together to provide you with the care you need, when you need it.  We recommend signing up for the patient portal called "MyChart".  Sign up information is provided on this After Visit Summary.  MyChart is used to connect with patients for Virtual Visits (Telemedicine).  Patients are able to view lab/test results, encounter notes, upcoming appointments, etc.  Non-urgent messages can be sent to your provider as well.   To learn more about what you can do with MyChart, go to [email or web address].    Your next appointment:   6 week(s)  Provider:   Jari Favre, PA-C

## 2023-01-20 NOTE — Progress Notes (Unsigned)
ZIO XT serial # [id number] from office inventory applied to patient.  Dr. Mayford Knife to read.

## 2023-01-22 ENCOUNTER — Ambulatory Visit: Payer: BC Managed Care – PPO | Admitting: Physician Assistant

## 2023-01-22 ENCOUNTER — Telehealth: Payer: Self-pay | Admitting: Physician Assistant

## 2023-01-22 ENCOUNTER — Telehealth: Payer: Self-pay | Admitting: Cardiology

## 2023-01-22 NOTE — Telephone Encounter (Signed)
Spoke to pt about an hour ago. Pt has not started her Toprol 12.5 mg yet, she will start tomorrow.  Advised to discuss if she should take Lopressor one time dose with CT RN when she calls. With the new start medication and not knowing how it will affect her HRs, unsure if/what dose she should take for CT. Patient verbalized understanding and agreeable to plan.

## 2023-01-22 NOTE — Telephone Encounter (Signed)
New Message:       Patient said she had an appointment with Jari Favre on Monday(01-20-23). Her question is, does she still need to see Dr Mayford Knife on 01-30-23.?

## 2023-01-22 NOTE — Telephone Encounter (Signed)
Spoke to pt about an hour ago. Advised to keep appt next week w/ Dr. Mendel Corning to go over CT findings. Pt agreeable.

## 2023-01-22 NOTE — Telephone Encounter (Signed)
Pt c/o medication issue:  1. Name of Medication: Metoprolol  2. How are you currently taking this medication (dosage and times per day)? 1/2 tablet a day  3. Are you having a reaction (difficulty breathing--STAT)?   4. What is your medication issue? Patient wants to know what time of day should she take this medicine ? Her nest question is, should she take Metoprolol before she have her CT on Monday(01-27-23)?or after the test?

## 2023-01-24 ENCOUNTER — Encounter (HOSPITAL_COMMUNITY): Payer: Self-pay

## 2023-01-27 ENCOUNTER — Ambulatory Visit (HOSPITAL_COMMUNITY)
Admission: RE | Admit: 2023-01-27 | Discharge: 2023-01-27 | Disposition: A | Discharge status: Home / Self Care | Payer: BC Managed Care – PPO | Source: Ambulatory Visit | Attending: Physician Assistant | Admitting: Physician Assistant

## 2023-01-27 DIAGNOSIS — R079 Chest pain, unspecified: Secondary | ICD-10-CM | POA: Diagnosis not present

## 2023-01-27 DIAGNOSIS — I700 Atherosclerosis of aorta: Secondary | ICD-10-CM

## 2023-01-27 MED ORDER — DILTIAZEM HCL 25 MG/5ML IV SOLN
5.0000 mg | Freq: Once | INTRAVENOUS | Status: AC
  Administered 2023-01-27: 5 mg via INTRAVENOUS

## 2023-01-27 MED ORDER — NITROGLYCERIN 0.4 MG SL SUBL
0.8000 mg | SUBLINGUAL_TABLET | SUBLINGUAL | Status: DC | PRN
  Administered 2023-01-27: 0.8 mg via SUBLINGUAL

## 2023-01-27 MED ORDER — DILTIAZEM HCL 25 MG/5ML IV SOLN
INTRAVENOUS | Status: AC
  Filled 2023-01-27: qty 5

## 2023-01-27 MED ORDER — IOHEXOL 350 MG/ML SOLN
100.0000 mL | Freq: Once | INTRAVENOUS | Status: AC | PRN
  Administered 2023-01-27: 100 mL via INTRAVENOUS

## 2023-01-27 MED ORDER — NITROGLYCERIN 0.4 MG SL SUBL
SUBLINGUAL_TABLET | SUBLINGUAL | Status: AC
  Filled 2023-01-27: qty 2

## 2023-01-28 DIAGNOSIS — E785 Hyperlipidemia, unspecified: Secondary | ICD-10-CM

## 2023-01-28 DIAGNOSIS — R079 Chest pain, unspecified: Secondary | ICD-10-CM

## 2023-01-28 MED ORDER — ASPIRIN 81 MG PO TBEC: 81.0000 mg | DELAYED_RELEASE_TABLET | Freq: Every day | ORAL | Status: AC

## 2023-01-28 NOTE — Telephone Encounter (Signed)
Sharlene Dory, PA-C 01/28/2023 11:50 AM EDT     Ms. Lindsay Schmidt, Your coronary calcium score was 150, this was 87 percentile for age, sex, and race matched controls.  You had some mild (25 to 49%) stenosis of your proximal LAD.   Plan: To reduce risk would recommend an LDL less than 55 and aggressive cholesterol regimen.  Please start ASA 81 mg which is over-the-counter.   If you have a recent lipid panel if you would not mind having them fax it to me if not, we will need to get a lipid panel and LFTs to ensure her LDL is at goal.     Thanks! Sharlene Dory, PA-C

## 2023-01-30 ENCOUNTER — Ambulatory Visit: Payer: BC Managed Care – PPO | Attending: Cardiology | Admitting: Cardiology

## 2023-01-30 ENCOUNTER — Encounter: Payer: Self-pay | Admitting: Cardiology

## 2023-01-30 VITALS — BP 142/98 | HR 66 | Ht 64.0 in | Wt 146.0 lb

## 2023-01-30 DIAGNOSIS — I2510 Atherosclerotic heart disease of native coronary artery without angina pectoris: Secondary | ICD-10-CM | POA: Diagnosis not present

## 2023-01-30 DIAGNOSIS — E785 Hyperlipidemia, unspecified: Secondary | ICD-10-CM

## 2023-01-30 DIAGNOSIS — I491 Atrial premature depolarization: Secondary | ICD-10-CM | POA: Diagnosis not present

## 2023-01-30 DIAGNOSIS — R079 Chest pain, unspecified: Secondary | ICD-10-CM

## 2023-01-30 DIAGNOSIS — I493 Ventricular premature depolarization: Secondary | ICD-10-CM | POA: Diagnosis not present

## 2023-01-30 DIAGNOSIS — R002 Palpitations: Secondary | ICD-10-CM | POA: Diagnosis not present

## 2023-01-30 DIAGNOSIS — Z79899 Other long term (current) drug therapy: Secondary | ICD-10-CM

## 2023-01-30 DIAGNOSIS — I2583 Coronary atherosclerosis due to lipid rich plaque: Secondary | ICD-10-CM

## 2023-01-30 DIAGNOSIS — I700 Atherosclerosis of aorta: Secondary | ICD-10-CM | POA: Insufficient documentation

## 2023-01-30 NOTE — Addendum Note (Signed)
Addended by: Quintella Reichert on: 01/30/2023 04:51 PM   Modules accepted: Orders

## 2023-01-30 NOTE — Progress Notes (Addendum)
Cardiology  Note    Date:  01/30/2023   ID:  Lindsay Schmidt, DOB 10-24-1960, MRN [id number]  PCP:  [facility]  Cardiologist:  Armanda Magic, MD   Chief Complaint  Patient presents with   Coronary Artery Disease   Hypertension   Hyperlipidemia   Palpitations     History of Present Illness:  Lindsay Schmidt is a 62 y.o. female with a hx of COVID 31 in May 2022.  She presented to the ER after testing + for COVID with complaints of palpitations and dizziness with near syncope.  EKG showed NSR and Cxray normal.  She was seen back in the office in 2022 with complaints of dizziness and palpitations.  Event monitor demonstrated occasional PACs, atrial triplets and couplets and rare PVCs.  She was recently seen back by Jari Favre, PA complaining of palpitations after going on a walk and started to feel presyncopal.  She took a propranolol.  She also at that time and had some grabbing pressure in her left chest under her breast for a few weeks.  It apparently had increased to occurring 15-20 times a day.    Coronary CTA 01/27/2023 demonstrated coronary calcium score of 150 which was 87 percentile for age sex and race matched controls with a moderate total plaque volume of 136 mm and mild nonobstructive CAD with 25 to 49% mixed plaque in the proximal LAD and mild aortic atherosclerosis.  She had a repeat heart monitor that she wore as well which showed predominant rhythm normal sinus rhythm with an average heart rate of 77 bpm with a rare PAC atrial couplet and rare PVC.  She is now here for follow-up.  She continues to have chest pain that is under her left breast and into her breast.  It is a tightness and heaviness "grabbing" that lasts about 15 seconds.  She says that it does not feel like a skipped heart beat.  She says that last Halloween she had COVID and did not have CP but in December 24 she saw her PCP and was having occasional tightness in her left breast but was not  concerned.  She says that in July after the episode of palpitations that she saw Julian Hy for she started having more of the "grabbing sensations".  She says that Friday she started Toprol XL 12.5mg  daily and that evening she was sitting watching TV and had a HARD beat and felt strange with lightheadedness and nervous.  She took her BP and it was 150/52mmHg and called 911.  Her O2 sats were 99% and HR was 82.  EMS did not think it was cardiac related.  Her EKG rhythm strip was normal.  Today she started having a shaky feeling and ate and 20 minutes later she had a feeling like she was going to pass out.  Her BP was 150's systolic. She took a propranolol which helped.    Past Medical History:  Diagnosis Date   Aortic atherosclerosis (HCC)    CAD (coronary artery disease), native coronary artery    Coronary CTA 02/11/2023 showed a coronary calcium score 150 with nonobstructive CAD 25 to 49% in the proximal LAD   COVID    HLD (hyperlipidemia)    LDL goal < 70   Palpitations    PACs and PVCs by heart monitors in 2022 in 2020 for    Past Surgical History:  Procedure Laterality Date   nonsustained atrial tachycardia     PVCs  Current Medications: Current Meds  Medication Sig   aspirin EC 81 MG tablet Take 1 tablet (81 mg total) by mouth daily. Swallow whole.   atorvastatin (LIPITOR) 20 MG tablet Take 1 tablet (20 mg total) by mouth daily.   metoprolol succinate (TOPROL XL) 25 MG 24 hr tablet Take 0.5 tablets (12.5 mg total) by mouth daily.   metoprolol tartrate (LOPRESSOR) 50 MG tablet Take 1 tablet (50 mg total) by mouth 2 (two) times daily. Take 1 tablet (50mg  total) by mouth 2 (two) hours prior to procedure   propranolol (INDERAL) 10 MG tablet Take 1 tablet (10 mg total) by mouth 3 (three) times daily as needed.    Allergies:   Patient has no known allergies.   Social History   Socioeconomic History   Marital status: Married    Spouse name: Not on file   Number of children: Not on  file   Years of education: Not on file   Highest education level: Not on file  Occupational History   Not on file  Tobacco Use   Smoking status: Never   Smokeless tobacco: Never  Vaping Use   Vaping status: Never Used  Substance and Sexual Activity   Alcohol use: Yes    Alcohol/week: 0.0 - 5.0 standard drinks of alcohol   Drug use: No   Sexual activity: Yes  Other Topics Concern   Not on file  Social History Narrative   Not on file   Social Determinants of Health   Financial Resource Strain: Not on file  Food Insecurity: Not on file  Transportation Needs: Not on file  Physical Activity: Not on file  Stress: Not on file (08/05/2019)  Social Connections: Unknown (11/06/2021)   Received from [facility]   Social Network    Social Network: Not on file     Family History:  The patient's family history includes Coronary artery disease in her brother.   ROS:   Please see the history of present illness.    ROS All other systems reviewed and are negative.      No data to display           PHYSICAL EXAM:   VS:  BP (!) 142/98   Pulse 66   Ht 5\' 4"  (1.626 m)   Wt 146 lb (66.2 kg)   SpO2 99%   BMI 25.06 kg/m    GEN: Well nourished, well developed in no acute distress HEENT: Normal NECK: No JVD; No carotid bruits LYMPHATICS: No lymphadenopathy CARDIAC:RRR, no murmurs, rubs, gallops RESPIRATORY:  Clear to auscultation without rales, wheezing or rhonchi  ABDOMEN: Soft, non-tender, non-distended MUSCULOSKELETAL:  No edema; No deformity  SKIN: Warm and dry NEUROLOGIC:  Alert and oriented x 3 PSYCHIATRIC:  Normal affect  Wt Readings from Last 3 Encounters:  01/30/23 146 lb (66.2 kg)  01/20/23 147 lb 3.2 oz (66.8 kg)  01/09/22 146 lb 3.2 oz (66.3 kg)      Studies/Labs Reviewed:   EKG Interpretation Date/Time:  Thursday January 30 2023 16:19:14 EDT Ventricular Rate:  66 PR Interval:  170 QRS Duration:  72 QT Interval:  420 QTC Calculation: 440 R  Axis:   77  Text Interpretation: Normal sinus rhythm Normal ECG When compared with ECG of 20-Jan-2023 08:54, No significant change was found Confirmed by Armanda Magic [phone]) on 01/30/2023 4:36:59 PM    Recent Labs: 01/20/2023: ALT 21; BUN 15; Creatinine, Ser 0.69; Hemoglobin 13.0; Magnesium 2.3; Platelets 294; Potassium 4.4; Sodium 137  Lipid Panel    Component Value Date/Time   CHOL 139 06/04/2021 0718   TRIG 65 06/04/2021 0718   HDL 60 06/04/2021 0718   CHOLHDL 2.3 06/04/2021 0718   LDLCALC 66 06/04/2021 0718    Additional studies/ records that were reviewed today include:  EKG and ER notes    ASSESSMENT:    1. Palpitations   2. Premature atrial contraction   3. PVC's (premature ventricular contractions)   4. Coronary artery disease due to lipid rich plaque   5. Hyperlipidemia LDL goal <70       PLAN:  In order of problems listed above:   Palpitations/PVCs/PACs -She has a long history of palpitations with a history of PVCs and PACs noted on 2 monitors including 1 that was done a few days ago -she just started Toprol 12.5mg  daily which she will continue.  Cannot titrate further due to soft BP on BPs log she brought -I have personally reviewed and interpreted outside labs performed by patient's PCP which showed K+ 4.4, Mag 2.3 and Hbg 13 and TSH 3.6 on 01/20/23 -I have recommended that she check her EKG on her apple watch when she has palpitations>>she has been doing this with no arrhythmias -I am suspicious that she may be having panic attacks as she gets anxious and then her BP goes up  2.  ASCAD -Coronary CTA 01/27/2023 demonstrated coronary calcium score of 150 which was 87 percentile for age sex and race matched controls with a moderate total plaque volume of 136 mm and mild nonobstructive CAD with 25 to 49% mixed plaque in the proximal LAD  -she continues to have chest pain under her left breast and into the breast - I suspect that this is not cardiac but she is very  concerned -Continue aspirin 81 mg daily, atorvastatin 20 mg daily and Toprol-XL  with as needed refills -I have recommended a Stress PET CT to rule out ischemia and assess coronary blood flow reserve -Informed Consent   Shared Decision Making/Informed Consent The risks [chest pain, shortness of breath, cardiac arrhythmias, dizziness, blood pressure fluctuations, myocardial infarction, stroke/transient ischemic attack, nausea, vomiting, allergic reaction, radiation exposure, metallic taste sensation and life-threatening complications (estimated to be 1 in 10,000)], benefits (risk stratification, diagnosing coronary artery disease, treatment guidance) and alternatives of a cardiac PET stress test were discussed in detail with Ms. Altergott and she agrees to proceed. -if stress test is normal then followup with PCP for treatment of possible panic attacks    3.  Hyperlipidemia -LDL goal less than 70 -Will check FLP and ALT as well as LP(a) -Continue atorvastatin 20 mg daily with as needed refills  Followup with Jari Favre, PA in 6 months and with me in 1 year  Time Spent: 20 minutes total time of encounter, including 15 minutes spent in face-to-face patient care on the date of this encounter. This time includes coordination of care and counseling regarding above mentioned problem list. Remainder of non-face-to-face time involved reviewing chart documents/testing relevant to the patient encounter and documentation in the medical record. I have independently reviewed documentation from referring provider  Medication Adjustments/Labs and Tests Ordered: Current medicines are reviewed at length with the patient today.  Concerns regarding medicines are outlined above.  Medication changes, Labs and Tests ordered today are listed in the Patient Instructions below.  There are no Patient Instructions on file for this visit.   Signed, Armanda Magic, MD  01/30/2023 4:23 PM    [facility]  [facility] [address] [place], [place], [place]  [address] Phone: [phone]; Fax: [phone]

## 2023-01-30 NOTE — Addendum Note (Signed)
Addended by: Luellen Pucker on: 01/30/2023 04:46 PM   Modules accepted: Orders

## 2023-01-30 NOTE — Patient Instructions (Signed)
Medication Instructions:  Your physician recommends that you continue on your current medications as directed. Please refer to the Current Medication list given to you today.  *If you need a refill on your cardiac medications before your next appointment, please call your pharmacy*   Lab Work: Please make an appointment to have FASTING labs  (lipid panel, ALT, LP(a)) performed at our lab.   If you have labs (blood work) drawn today and your tests are completely normal, you will receive your results only by: MyChart Message (if you have MyChart) OR A paper copy in the mail If you have any lab test that is abnormal or we need to change your treatment, we will call you to review the results.   Testing/Procedures: How to Prepare for Your Cardiac PET/CT Stress Test:  1. Please do not take these medications before your test:   Medications that may interfere with the cardiac pharmacological stress agent (ex. nitrates - including erectile dysfunction medications, isosorbide mononitrate, tamulosin or beta-blockers) the day of the exam. (Erectile dysfunction medication should be held for at least 72 hrs prior to test) Theophylline containing medications for 12 hours. Dipyridamole 48 hours prior to the test. Your remaining medications may be taken with water.  2. Nothing to eat or drink, except water, 3 hours prior to arrival time.   NO caffeine/decaffeinated products, or chocolate 12 hours prior to arrival.  3. NO perfume, cologne or lotion  4. Total time is 1 to 2 hours; you may want to bring reading material for the waiting time.  5. Please report to Radiology at the [facility] Main Entrance 30 minutes early for your test.  [address] [place], [place] [address]  6. Please report to Radiology at [facility] Main Entrance, medical mall, 30 mins prior to your test.  [address]  [place], [place]  [phone]  Diabetic Preparation:  Hold  oral medications. You may take NPH and Lantus insulin. Do not take Humalog or Humulin R (Regular Insulin) the day of your test. Check blood sugars prior to leaving the house. If able to eat breakfast prior to 3 hour fasting, you may take all medications, including your insulin, Do not worry if you miss your breakfast dose of insulin - start at your next meal.  IF YOU THINK YOU MAY BE PREGNANT, OR ARE NURSING PLEASE INFORM THE TECHNOLOGIST.  In preparation for your appointment, medication and supplies will be purchased.  Appointment availability is limited, so if you need to cancel or reschedule, please call the Radiology Department at [phone] [address]) OR [phone] [facility])  24 hours in advance to avoid a cancellation fee of $100.00  What to Expect After you Arrive:  Once you arrive and check in for your appointment, you will be taken to a preparation room within the Radiology Department.  A technologist or Nurse will obtain your medical history, verify that you are correctly prepped for the exam, and explain the procedure.  Afterwards,  an IV will be started in your arm and electrodes will be placed on your skin for EKG monitoring during the stress portion of the exam. Then you will be escorted to the PET/CT scanner.  There, staff will get you positioned on the scanner and obtain a blood pressure and EKG.  During the exam, you will continue to be connected to the EKG and blood pressure machines.  A small, safe amount of a radioactive tracer will be injected in your IV  to obtain a series of pictures of your heart along with an injection of a stress agent.    After your Exam:  It is recommended that you eat a meal and drink a caffeinated beverage to counter act any effects of the stress agent.  Drink plenty of fluids for the remainder of the day and urinate frequently for the first couple of hours after the exam.  Your doctor will inform you of your test results within 7-10 business  days.  For more information and frequently asked questions, please visit our website : [email or web address]  For questions about your test or how to prepare for your test, please call: Cardiac Imaging Nurse Navigators Office: [phone]    Follow-Up: At [facility], you and your health needs are our priority.  As part of our continuing mission to provide you with exceptional heart care, we have created designated Provider Care Teams.  These Care Teams include your primary Cardiologist (physician) and Advanced Practice Providers (APPs -  Physician Assistants and Nurse Practitioners) who all work together to provide you with the care you need, when you need it.  We recommend signing up for the patient portal called "MyChart".  Sign up information is provided on this After Visit Summary.  MyChart is used to connect with patients for Virtual Visits (Telemedicine).  Patients are able to view lab/test results, encounter notes, upcoming appointments, etc.  Non-urgent messages can be sent to your provider as well.   To learn more about what you can do with MyChart, go to [email or web address].    Your next appointment:   6 month(s)  Provider:   Jari Favre, PA-C     Then, Armanda Magic, MD will plan to see you again in 1 year(s).

## 2023-01-31 ENCOUNTER — Ambulatory Visit: Payer: BC Managed Care – PPO | Attending: Cardiovascular Disease

## 2023-01-31 DIAGNOSIS — R079 Chest pain, unspecified: Secondary | ICD-10-CM

## 2023-01-31 DIAGNOSIS — E785 Hyperlipidemia, unspecified: Secondary | ICD-10-CM

## 2023-01-31 LAB — LIPID PANEL
Chol/HDL Ratio: 2.2 ratio (ref 0.0–4.4)
Cholesterol, Total: 122 mg/dL (ref 100–199)
HDL: 55 mg/dL (ref >39)
LDL Chol Calc (NIH): 53 mg/dL (ref 0–99)
Triglycerides: 68 mg/dL (ref 0–149)
VLDL Cholesterol Cal: 14 mg/dL (ref 5–40)

## 2023-01-31 LAB — HEPATIC FUNCTION PANEL
ALT: 25 IU/L (ref 0–32)
AST: 25 IU/L (ref 0–40)
Albumin: 4.4 g/dL (ref 3.9–4.9)
Alkaline Phosphatase: 69 IU/L (ref 44–121)
Bilirubin Total: 0.5 mg/dL (ref 0.0–1.2)
Bilirubin, Direct: 0.17 mg/dL (ref 0.00–0.40)
Total Protein: 7.1 g/dL (ref 6.0–8.5)

## 2023-02-05 ENCOUNTER — Encounter: Payer: Self-pay | Admitting: Cardiology

## 2023-02-24 ENCOUNTER — Other Ambulatory Visit: Payer: Self-pay | Admitting: Cardiology

## 2023-03-04 ENCOUNTER — Telehealth (HOSPITAL_COMMUNITY): Payer: Self-pay | Admitting: Emergency Medicine

## 2023-03-04 NOTE — Telephone Encounter (Signed)
Reaching out to patient to offer assistance regarding upcoming cardiac imaging study; pt verbalizes understanding of appt date/time, parking situation and where to check in, pre-test NPO status and medications ordered, and verified current allergies; name and call back number provided for further questions should they arise Sara Wallace RN Navigator Cardiac Imaging [facility] [phone] office [phone] cell 

## 2023-03-06 ENCOUNTER — Ambulatory Visit
Admission: RE | Admit: 2023-03-06 | Discharge: 2023-03-06 | Disposition: A | Discharge status: Home / Self Care | Payer: BC Managed Care – PPO | Source: Ambulatory Visit | Attending: Cardiology | Admitting: Cardiology

## 2023-03-06 DIAGNOSIS — I700 Atherosclerosis of aorta: Secondary | ICD-10-CM | POA: Diagnosis not present

## 2023-03-06 DIAGNOSIS — I2510 Atherosclerotic heart disease of native coronary artery without angina pectoris: Secondary | ICD-10-CM | POA: Diagnosis not present

## 2023-03-06 DIAGNOSIS — R079 Chest pain, unspecified: Secondary | ICD-10-CM | POA: Insufficient documentation

## 2023-03-06 LAB — NM PET CT CARDIAC PERFUSION MULTI W/ABSOLUTE BLOODFLOW
LV dias vol: 60 mL (ref 46–106)
LV sys vol: 16 mL
MBFR: 2.46
Nuc Rest EF: 70 %
Nuc Stress EF: 73 %
Peak HR: 115 {beats}/min
Rest HR: 76 {beats}/min
Rest MBF: 1.54 ml/g/min
Rest Nuclear Isotope Dose: 17.7 mCi
ST Depression (mm): 0 mm
Stress MBF: 3.79 ml/g/min
Stress Nuclear Isotope Dose: 17.3 mCi
TID: 0.75

## 2023-03-06 MED ORDER — RUBIDIUM RB82 GENERATOR (RUBYFILL)
25.0000 | PACK | Freq: Once | INTRAVENOUS | Status: AC
  Administered 2023-03-06: 17.71 via INTRAVENOUS

## 2023-03-06 MED ORDER — REGADENOSON 0.4 MG/5ML IV SOLN
0.4000 mg | Freq: Once | INTRAVENOUS | Status: AC
  Administered 2023-03-06: 0.4 mg via INTRAVENOUS
  Filled 2023-03-06: qty 5

## 2023-03-06 MED ORDER — RUBIDIUM RB82 GENERATOR (RUBYFILL)
25.0000 | PACK | Freq: Once | INTRAVENOUS | Status: AC
  Administered 2023-03-06: 17.32 via INTRAVENOUS

## 2023-03-14 ENCOUNTER — Ambulatory Visit: Payer: BC Managed Care – PPO | Admitting: Physician Assistant

## 2023-03-19 DIAGNOSIS — I493 Ventricular premature depolarization: Secondary | ICD-10-CM | POA: Diagnosis not present

## 2023-03-19 DIAGNOSIS — E782 Mixed hyperlipidemia: Secondary | ICD-10-CM | POA: Diagnosis not present

## 2023-03-19 DIAGNOSIS — I2510 Atherosclerotic heart disease of native coronary artery without angina pectoris: Secondary | ICD-10-CM | POA: Diagnosis not present

## 2023-03-19 DIAGNOSIS — R002 Palpitations: Secondary | ICD-10-CM | POA: Diagnosis not present

## 2023-04-15 DIAGNOSIS — L814 Other melanin hyperpigmentation: Secondary | ICD-10-CM | POA: Diagnosis not present

## 2023-04-15 DIAGNOSIS — L821 Other seborrheic keratosis: Secondary | ICD-10-CM | POA: Diagnosis not present

## 2023-04-15 DIAGNOSIS — L578 Other skin changes due to chronic exposure to nonionizing radiation: Secondary | ICD-10-CM | POA: Diagnosis not present

## 2023-04-15 DIAGNOSIS — D225 Melanocytic nevi of trunk: Secondary | ICD-10-CM | POA: Diagnosis not present

## 2023-04-22 ENCOUNTER — Telehealth: Payer: Self-pay

## 2023-04-22 ENCOUNTER — Encounter: Payer: Self-pay | Admitting: Cardiology

## 2023-04-22 MED ORDER — METOPROLOL SUCCINATE ER 25 MG PO TB24: 12.5000 mg | ORAL_TABLET | Freq: Every day | ORAL | 3 refills | Status: DC

## 2023-04-22 NOTE — Telephone Encounter (Signed)
Patient requests [facility] of [facility]. Patient states she no longer takes lopressor.

## 2023-06-20 DIAGNOSIS — R7303 Prediabetes: Secondary | ICD-10-CM | POA: Diagnosis not present

## 2023-06-20 DIAGNOSIS — E559 Vitamin D deficiency, unspecified: Secondary | ICD-10-CM | POA: Diagnosis not present

## 2023-06-20 DIAGNOSIS — Z0000 Encounter for general adult medical examination without abnormal findings: Secondary | ICD-10-CM | POA: Diagnosis not present

## 2023-06-20 DIAGNOSIS — E785 Hyperlipidemia, unspecified: Secondary | ICD-10-CM | POA: Diagnosis not present

## 2023-07-23 DIAGNOSIS — D1720 Benign lipomatous neoplasm of skin and subcutaneous tissue of unspecified limb: Secondary | ICD-10-CM | POA: Diagnosis not present

## 2023-07-28 NOTE — Progress Notes (Signed)
 Cardiology Office Note:  .   Date:  07/29/2023  ID:  Lindsay Schmidt, DOB 1961-02-24, MRN [id number] PCP: [facility] Providers Cardiologist:  Wilbert Bihari, MD {  History of Present Illness: .   Lindsay Schmidt is a 63 y.o. female with a past medical history of palpitations, frequent PVCs and PACs, hyperlipidemia, mild mitral regurgitation, elevated coronary calcium  score here for follow-up appointment.  Referred by PCP for cardiology evaluation of palpitations with near syncope.  Seen by Dr. Bihari on 02/05/2021.  Described palpitations and dizziness onset after COVID infection May 2022.  Describes daily palpitations, feeling off and episode of losing her peripheral vision lasting a second while standing.  Also, feeling fatigued and entire body feels heavy.  Stress echo 2019 was normal.  EKG in the ER 11/03/2020 revealed normal sinus rhythm.  Coronary calcium  score was 153, 90 percentile for age/race/sex matched controls.  Cardiac monitor revealed predominantly normal sinus rhythm, occasional PACs, atrial couplets and triplets, rare PVC.  Echo revealed normal LVEF 55% and mild mitral regurgitation.  She called the on-call provider/6/23 with complaint of palpitations.  Reported she had an episode of sudden palpitations and became dizzy and lightheaded.  ED precautions were advised and follow-up appointment scheduled.  She was seen 10/09/2021 and had reported on 4/6 she was in her kitchen when she began to feel some chest pain and pounding as well as a funny feeling in her head like she might pass out.  Body felt limp.  Was able to lean against a wall and drink some water.  No syncope.  Stays well-hydrated.  Has less than 5 events palpitation between Christmas and 1 April, episode 4/6 was worse when she sat in a long time.  Feeling more palpitations when she is stressed with lack of sleep.  Started on metoprolol  succinate 12.5 mg each evening.  Plan to monitor blood  pressure and symptoms.  Was seen 12/2021 and was taking both metoprolol  and propranolol .  Occasionally has episodes of multiple palpitations close together.  On 6/24 had a run of palpitations just prior to having gas, over and consider taking propranolol  but was concerned about medication side effects.  No recurrence of extended palpitations since that time.  No chest pain, shortness of breath, lower extremity edema, fatigue, diaphoresis, weakness, syncope, presyncope.  No orthopnea or PND.  I saw her in July 2024, she shares she has had heart palpitations since age 41. Given two medications and she can start a BB if needed (metoprolol  and propranolol ). Rapid heart beat in the past and felt like she was going to faint. Mid morning went for a walk, ate a half of a banana. She was almost home and started palpitating and saw black, did take one of the propranolols didn't have any issues. She has some grabbing or pressure in her left chest under her breast. Might have it every couple of weeks. July its increased and now is happening 15-20 times a day. Sometimes a positional issue but other times with exertion.    Married to a nurse, lots of bypass surgery and stents in her family, strong family hx of heart related issues. Sometimes she feels a little off kilter. She is getting a little older. Chocolate and drinks 1-2 beers a night but no coffee. Exercises regularly.  Reports no shortness of breath nor dyspnea on exertion. Reports no chest pain, pressure, or tightness. No edema, orthopnea, PND.  She was seen by Dr. Bihari  August 2024 and continued to have chest pain that she describes as tightness and heaviness grabbing that lasts about 15 seconds.  She also endorsed hard beating and strange feeling of lightheadedness and nervousness.  Blood pressure was elevated at 150/90.  She called EMS.  EMS did not think it was cardiac related.  EKG was normal.  She stated that the day of her appointment she was having  a shaky feeling and 820 minutes later she felt like she was get a pass out.  Blood pressure in the 150s systolic.  Took a propranolol , which helped.  She was encouraged to continue metoprolol  succinate 12.5 mg daily which we could not titrate further due to low blood pressure.  Labs were unremarkable.  Palpitations were thought to be panic attacks.  Since she had continued chest pain under her left breast a stress PET/CT was ordered.  Today, she presents with a history of chest discomfort and palpitations, reports a decrease in the frequency and intensity of these symptoms. She describes the discomfort as a grabbing tightness in the chest, which occurs occasionally and lasts for about fifteen seconds. The patient also experiences palpitations, described as multi palps or palps, a few times a week. These symptoms are more likely to occur when the patient is stressed or has not slept well.  The patient is currently on metoprolol  daily and propranolol  as needed, which she believes has helped reduce the frequency of the palpitations. She also reports feeling exhausted after episodes of increased heart rate and blood pressure, which she attributes to the stress and anxiety associated with these episodes rather than the propranolol .  The patient's cholesterol levels were checked after the holidays, with an LDL level of 89, higher than the desired level of less than 70. The patient acknowledges that her diet may not have been optimal during the holiday period.  The patient also discusses her efforts to manage her anxiety, including box breathing techniques and reading the Bible. She notes that stressful situations, such as a recent phone call with her distressed mother-in-law, can trigger her palpitations.  Reports no shortness of breath nor dyspnea on exertion. Reports no chest pain, pressure, or tightness. No edema, orthopnea, PND.    Discussed the use of AI scribe software for clinical note  transcription with the patient, who gave verbal consent to proceed.  ROS: pertinent ROS in HPI  Studies Reviewed: SABRA        The following studies were reviewed today:  Stress PET/CT September 2024  The study is normal. The study is low risk.   No ST deviation was noted. There were no arrhythmias during stress. There were no arrhythmias during recovery.   LV perfusion is normal. There is no evidence of ischemia. There is no evidence of infarction.   Rest left ventricular function is normal. Rest EF: 70%. Stress left ventricular function is normal. Stress EF: 73%. End diastolic cavity size is normal. End systolic cavity size is normal. No evidence of transient ischemic dilation (TID) noted.   Myocardial blood flow was computed to be 1.54ml/g/min at rest and 3.3ml/g/min at stress. Global myocardial blood flow reserve was 2.46 and was normal. High resting flows.   Coronary calcium  was present on the attenuation correction CT images. Mild coronary calcifications were present. Coronary calcifications were present in the left anterior descending artery and right coronary artery distribution(s).   Electronically signed by: Soyla DELENA Merck, MD   Echo 04/09/21   Left Ventricle: Left ventricular ejection fraction by  3D volume is 55 %.  The left ventricle has normal function. The left ventricle has no regional  wall motion abnormalities. The average left ventricular global  longitudinal strain is -19.6 %. The global   longitudinal strain is normal. The left ventricular internal cavity size  was normal in size. There is no left ventricular hypertrophy. Left  ventricular diastolic parameters were normal.  Right Ventricle: The right ventricular size is normal. No increase in  right ventricular wall thickness. Right ventricular systolic function is  normal. There is normal pulmonary artery systolic pressure. The tricuspid  regurgitant velocity is 1.94 m/s, and   with an assumed right atrial  pressure of 3 mmHg, the estimated right  ventricular systolic pressure is 18.1 mmHg.  Left Atrium: Left atrial size was normal in size.  Right Atrium: Right atrial size was normal in size.  Pericardium: There is no evidence of pericardial effusion.  Mitral Valve: The mitral valve is normal in structure. Mild mitral valve  regurgitation. No evidence of mitral valve stenosis.  Tricuspid Valve: The tricuspid valve is normal in structure. Tricuspid  valve regurgitation is trivial. No evidence of tricuspid stenosis.  Aortic Valve: The aortic valve is tricuspid. Aortic valve regurgitation is  trivial. No aortic stenosis is present.  Pulmonic Valve: The pulmonic valve was grossly normal. Pulmonic valve  regurgitation is mild. No evidence of pulmonic stenosis.  Aorta: The aortic root is normal in size and structure.  Venous: The inferior vena cava is normal in size with greater than 50%  respiratory variability, suggesting right atrial pressure of 3 mmHg.  IAS/Shunts: No atrial level shunt detected by color flow Doppler.      Cardiac monitor 03/19/21   Predominant rhythm was normal sinus rhythm with average heart rate 78bpm and ranged from 53 to 156bpm. Occasional PACs, atrial couplets and triplets Rare PVC     CT Calcium  Score 02/23/21   Coronary arteries: Normal origins.   Coronary Calcium  Score:   Left main: 0   Left anterior descending artery: 149   Left circumflex artery: 0   Right coronary artery: 4.27   Total: 153   Percentile: 90th   Pericardium: Normal.   Ascending Aorta: Normal caliber.   Non-cardiac: See separate report from [facility].   IMPRESSION: Coronary calcium  score of 153. This was 90th percentile for age-, race-, and sex-matched controls.      Physical Exam:   VS:  BP 138/80   Pulse 73   Ht 5' 4 (1.626 m)   Wt 149 lb 9.6 oz (67.9 kg)   SpO2 97%   BMI 25.68 kg/m    Wt Readings from Last 3 Encounters:  07/29/23 149 lb 9.6 oz (67.9 kg)   03/06/23 148 lb (67.1 kg)  01/30/23 146 lb (66.2 kg)    GEN: Well nourished, well developed in no acute distress NECK: No JVD; No carotid bruits CARDIAC: RRR, no murmurs, rubs, gallops RESPIRATORY:  Clear to auscultation without rales, wheezing or rhonchi  ABDOMEN: Soft, non-tender, non-distended EXTREMITIES:  No edema; No deformity   ASSESSMENT AND PLAN: .   Mild Coronary Artery Disease Mild disease in the LAD and right coronary artery. No symptoms. LDL slightly elevated at 89 (goal <70). -Continue Lipitor. -Recheck lipid panel in 8 weeks (end of March 2025).  Palpitations Intermittent palpitations, likely runs of PACs/PVCs. No significant distress or prolonged episodes. -Continue Metoprolol  daily and Propranolol  as needed for breakthrough symptoms.  Anxiety Possible contribution to palpitations. Using box breathing method and  spiritual practices for management. -Continue current management strategies.   Dispo: she can follow-up in 6 months   Signed, Orren LOISE Fabry, PA-C

## 2023-07-29 ENCOUNTER — Encounter: Payer: Self-pay | Admitting: Physician Assistant

## 2023-07-29 ENCOUNTER — Ambulatory Visit: Payer: BC Managed Care – PPO | Attending: Physician Assistant | Admitting: Physician Assistant

## 2023-07-29 VITALS — BP 138/80 | HR 73 | Ht 64.0 in | Wt 149.6 lb

## 2023-07-29 DIAGNOSIS — Z79899 Other long term (current) drug therapy: Secondary | ICD-10-CM

## 2023-07-29 DIAGNOSIS — R002 Palpitations: Secondary | ICD-10-CM | POA: Diagnosis not present

## 2023-07-29 DIAGNOSIS — I491 Atrial premature depolarization: Secondary | ICD-10-CM | POA: Diagnosis not present

## 2023-07-29 DIAGNOSIS — I340 Nonrheumatic mitral (valve) insufficiency: Secondary | ICD-10-CM

## 2023-07-29 DIAGNOSIS — I493 Ventricular premature depolarization: Secondary | ICD-10-CM | POA: Diagnosis not present

## 2023-07-29 DIAGNOSIS — I2510 Atherosclerotic heart disease of native coronary artery without angina pectoris: Secondary | ICD-10-CM

## 2023-07-29 DIAGNOSIS — E785 Hyperlipidemia, unspecified: Secondary | ICD-10-CM

## 2023-07-29 DIAGNOSIS — R079 Chest pain, unspecified: Secondary | ICD-10-CM

## 2023-07-29 DIAGNOSIS — I2583 Coronary atherosclerosis due to lipid rich plaque: Secondary | ICD-10-CM

## 2023-07-29 NOTE — Patient Instructions (Signed)
Medication Instructions:  Your physician recommends that you continue on your current medications as directed. Please refer to the Current Medication list given to you today.  *If you need a refill on your cardiac medications before your next appointment, please call your pharmacy*  Lab Work: None ordered today.  Testing/Procedures: None ordered today.  Follow-Up: At [facility], you and your health needs are our priority.  As part of our continuing mission to provide you with exceptional heart care, we have created designated Provider Care Teams.  These Care Teams include your primary Cardiologist (physician) and Advanced Practice Providers (APPs -  Physician Assistants and Nurse Practitioners) who all work together to provide you with the care you need, when you need it.  Your next appointment:   6 month(s)  Provider:   Armanda Magic, MD   Other Instructions          1st Floor: - Lobby - Registration  - Pharmacy  - Lab - Cafe  2nd Floor: - PV Lab - Diagnostic Testing (echo, CT, nuclear med)  3rd Floor: - Vacant  4th Floor: - TCTS (cardiothoracic surgery) - AFib Clinic - Structural Heart Clinic - Vascular Surgery  - Vascular Ultrasound  5th Floor: - [facility] Cardiology (general and EP) - Clinical Pharmacy for coumadin, hypertension, lipid, weight-loss medications, and med management appointments    Valet parking services will be available as well.

## 2023-08-04 DIAGNOSIS — Z1231 Encounter for screening mammogram for malignant neoplasm of breast: Secondary | ICD-10-CM | POA: Diagnosis not present

## 2023-09-22 DIAGNOSIS — E785 Hyperlipidemia, unspecified: Secondary | ICD-10-CM | POA: Diagnosis not present

## 2023-09-22 LAB — LIPID PANEL
Chol/HDL Ratio: 2.7 ratio (ref 0.0–4.4)
Cholesterol, Total: 171 mg/dL (ref 100–199)
HDL: 64 mg/dL (ref >39)
LDL Chol Calc (NIH): 91 mg/dL (ref 0–99)
Triglycerides: 84 mg/dL (ref 0–149)
VLDL Cholesterol Cal: 16 mg/dL (ref 5–40)

## 2023-09-23 ENCOUNTER — Telehealth: Payer: Self-pay | Admitting: *Deleted

## 2023-09-23 ENCOUNTER — Encounter: Payer: Self-pay | Admitting: Physician Assistant

## 2023-09-23 DIAGNOSIS — I2510 Atherosclerotic heart disease of native coronary artery without angina pectoris: Secondary | ICD-10-CM

## 2023-09-23 DIAGNOSIS — Z79899 Other long term (current) drug therapy: Secondary | ICD-10-CM

## 2023-09-23 DIAGNOSIS — E785 Hyperlipidemia, unspecified: Secondary | ICD-10-CM

## 2023-09-23 NOTE — Telephone Encounter (Signed)
-----   Message from Sharlene Dory sent at 09/23/2023  4:32 PM EDT ----- Please tell her to try and make some dietary changes ad we will recheck a lipid panel in 2 months. If its the same, we will need to increase her statin.  Thanks! Sharlene Dory, PA-C ----- Message ----- From: Loa Socks, LPN Sent: 06/29/1094   4:04 PM EDT To: Sharlene Dory, PA-C  Please advise--she is taking statin and going to gym, but not compliant with her diet  Thanks friend, Lajoyce Corners ----- Message ----- From: Bunnie Domino Sent: 09/23/2023   3:34 PM EDT To: Loa Socks, LPN  Ms. Petko, Have there been any major changes in your diet or in your medications?  Your LDL almost doubled from 7 months ago.  Unsure why.  Are you still taking your statin?  Triglycerides look good at 84.  We need to try to get your LDL down under 70 again. Thanks! Sharlene Dory, PA-C

## 2023-09-23 NOTE — Telephone Encounter (Signed)
 The patient has been notified of the result and verbalized understanding.  All questions (if any) were answered.  Advised the pt that per [facility], she would like for her to try and make some dietary changes and continue her current dose of atorvastatin, and we will recheck her lipids in 2 months to reassess. Pt is aware that if her numbers are the same in 2 months, then we will increase her statin at that time.  Pt education provided about the mediterranean diet.   Pt aware we will recheck her lipids in 2 months (in June) and to write this down on her calendar to make sure she goes to a [facility] to have done.  Advised her to go fasting to that lab appt.   Lipid order in 2 months placed and released in the system.  Pt verbalized understanding and agrees with this plan.

## 2023-10-08 ENCOUNTER — Telehealth: Payer: Self-pay

## 2023-10-08 ENCOUNTER — Encounter: Payer: Self-pay | Admitting: Physician Assistant

## 2023-10-08 NOTE — Telephone Encounter (Signed)
 See phone note from today 4/16

## 2023-10-08 NOTE — Telephone Encounter (Signed)
 Spoke with pt regarding her blood pressures. Pt stated she takes her metoprolol at night and when she took her BP this morning around 11 am her blood pressure was 149/91 so she took a half a dose of her metoprolol as directed by Dr. Micael Adas per pt's recall. Pt stated her BP is usually around 120/80. Pt stated her BP has recently been 130/80s at the beginning of April but have recently been in the 140/90s. Pt stated she has not been feeling well and wonders if it is the Metoprolol. Pt stated she has been feeling lightheaded and tired. Pt was told to take a log of her blood pressures, 2 hours after taking her medications, for the rest of the week and send us  the readings. Pt was told the information provided would be sent to Dr. Micael Adas for suggestions. Pt verbalized understanding. All questions if any were answered.

## 2023-10-21 ENCOUNTER — Other Ambulatory Visit: Payer: Self-pay | Admitting: *Deleted

## 2023-10-21 MED ORDER — METOPROLOL SUCCINATE ER 25 MG PO TB24: 12.5000 mg | ORAL_TABLET | Freq: Every day | ORAL | 3 refills | Status: DC

## 2023-10-21 NOTE — Progress Notes (Signed)
 Called and made patient aware per Dr. Micael Adas, blood pressure for the most part look great. Made patient aware per Dr. Micael Adas to take an additional half dose of metoprolol  if blood pressure reading greater than 150/90. Made patient aware to continue to check blood pressure and send those readings. Patient verbalized understanding.

## 2023-12-09 LAB — LIPID PANEL
Chol/HDL Ratio: 2.6 ratio (ref 0.0–4.4)
Cholesterol, Total: 208 mg/dL — ABNORMAL HIGH (ref 100–199)
HDL: 79 mg/dL (ref >39)
LDL Chol Calc (NIH): 116 mg/dL — ABNORMAL HIGH (ref 0–99)
Triglycerides: 73 mg/dL (ref 0–149)
VLDL Cholesterol Cal: 13 mg/dL (ref 5–40)

## 2023-12-10 ENCOUNTER — Ambulatory Visit: Payer: Self-pay | Admitting: Physician Assistant

## 2023-12-10 DIAGNOSIS — I2510 Atherosclerotic heart disease of native coronary artery without angina pectoris: Secondary | ICD-10-CM

## 2023-12-10 DIAGNOSIS — Z79899 Other long term (current) drug therapy: Secondary | ICD-10-CM

## 2023-12-10 DIAGNOSIS — E785 Hyperlipidemia, unspecified: Secondary | ICD-10-CM

## 2023-12-11 MED ORDER — ATORVASTATIN CALCIUM 40 MG PO TABS
40.0000 mg | ORAL_TABLET | Freq: Every day | ORAL | 1 refills | Status: DC
Start: 2023-12-11 — End: 2024-05-05

## 2023-12-11 NOTE — Telephone Encounter (Signed)
 The patient has been notified of the result and verbalized understanding.  All questions (if any) were answered.  Pt aware to increase her atorvastatin  to 40 mg po daily.  She is aware she can double up on her 20 mg tablets for now, until her delivery of the 40 mg tablets of atorvastatin  arrives.   Pt is aware that we will repeat her lipids and ALT in 3 months to reassess her numbers.  Advised her to mark this down on her calendar for around 09/19 or later.  Advised her to go fasting to this lab appt.   Confirmed the pharmacy of choice with the pt.  Pt verbalized understanding and agrees with this plan.

## 2023-12-11 NOTE — Telephone Encounter (Signed)
-----   Message from Marlyse Single sent at 12/10/2023 11:48 AM EDT ----- Results sent to Charm Coombs via MyChart message.   PLAN: -Increase Atorvastatin  to 40 mg once daily  -Lipids, ALT in 3 mos (order under [place])  I have reviewed your results for Tessa while she is out of the office. Your LDL cholesterol is higher. We will increase the Atorvastatin  to 40 mg once daily and recheck labs again in 3 mos. Marlyse Single, PA-C    12/10/2023 11:45 AM   ----- Message ----- From: [facility] Results In Sent: 12/09/2023   1:36 AM EDT To: Von Grumbling, PA-C

## 2024-02-06 ENCOUNTER — Encounter: Payer: Self-pay | Admitting: Family Medicine

## 2024-02-08 ENCOUNTER — Other Ambulatory Visit: Payer: Self-pay | Admitting: Physician Assistant

## 2024-02-08 DIAGNOSIS — R911 Solitary pulmonary nodule: Secondary | ICD-10-CM

## 2024-02-09 ENCOUNTER — Encounter: Payer: Self-pay | Admitting: Cardiology

## 2024-02-09 ENCOUNTER — Ambulatory Visit: Attending: Cardiology | Admitting: Cardiology

## 2024-02-09 VITALS — BP 120/72 | HR 80 | Ht 63.5 in | Wt 147.6 lb

## 2024-02-09 DIAGNOSIS — I491 Atrial premature depolarization: Secondary | ICD-10-CM | POA: Diagnosis present

## 2024-02-09 DIAGNOSIS — E785 Hyperlipidemia, unspecified: Secondary | ICD-10-CM | POA: Diagnosis present

## 2024-02-09 DIAGNOSIS — I493 Ventricular premature depolarization: Secondary | ICD-10-CM | POA: Diagnosis present

## 2024-02-09 DIAGNOSIS — I2583 Coronary atherosclerosis due to lipid rich plaque: Secondary | ICD-10-CM | POA: Insufficient documentation

## 2024-02-09 DIAGNOSIS — R002 Palpitations: Secondary | ICD-10-CM | POA: Diagnosis not present

## 2024-02-09 DIAGNOSIS — I2510 Atherosclerotic heart disease of native coronary artery without angina pectoris: Secondary | ICD-10-CM | POA: Insufficient documentation

## 2024-02-09 NOTE — Progress Notes (Signed)
 Cardiology  Note    Date:  02/09/2024   ID:  Lindsay Schmidt, DOB 1961/02/04, MRN [id number]  PCP:  Lennice Mliss NOVAK, PA  Cardiologist:  Wilbert Bihari, MD   Chief Complaint  Patient presents with   Follow-up     palpitations, CAD, PVCs, PACs, hyperlipidemia     History of Present Illness:  Lindsay Schmidt is a 63 y.o. female with a hx of COVID 63 in May 2022.  She presented to the ER after testing + for COVID with complaints of palpitations and dizziness with near syncope.  EKG showed NSR and Cxray normal.  She was seen back in the office in 2022 with complaints of dizziness and palpitations.  Event monitor demonstrated occasional PACs, atrial triplets and couplets and rare PVCs.  She was recently seen back by Orren Fabry, PA complaining of palpitations after going on a walk and started to feel presyncopal.  She took a propranolol .  She also at that time and had some grabbing pressure in her left chest under her breast for a few weeks.  It apparently had increased to occurring 15-20 times a day.    Coronary CTA 01/27/2023 demonstrated coronary calcium  score of 150 which was 87 percentile for age sex and race matched controls with a moderate total plaque volume of 136 mm and mild nonobstructive CAD with 25 to 49% mixed plaque in the proximal LAD and mild aortic atherosclerosis.  She had a repeat heart monitor that she wore as well which showed predominant rhythm normal sinus rhythm with an average heart rate of 77 bpm with a rare PAC atrial couplet and rare PVC.  She is now here for follow-up.  She is here today for follow-up and is doing well.  She still has her chronic pain in her left breast that she thinks is related to having PVC's.  The tightness lasts a few seconds and if she moves her bra it resolves. She denies any shortness of breath, DOE, PND, orthopnea, lower extremity edema, dizziness or syncope.  Occasionally she will feel her extra heart beats which are sporadic but very stable.   Past  Medical History:  Diagnosis Date   Aortic atherosclerosis (HCC)    CAD (coronary artery disease), native coronary artery    Coronary CTA 02/11/2023 showed a coronary calcium  score 150 with nonobstructive CAD 25 to 49% in the proximal LAD   COVID    HLD (hyperlipidemia)    LDL goal < 70   Palpitations    PACs and PVCs by heart monitors in 2022 in 2020 for    Past Surgical History:  Procedure Laterality Date   nonsustained atrial tachycardia     PVCs      Current Medications: Current Meds  Medication Sig   aspirin  EC 81 MG tablet Take 1 tablet (81 mg total) by mouth daily. Swallow whole.   atorvastatin  (LIPITOR) 40 MG tablet Take 1 tablet (40 mg total) by mouth daily.   cholecalciferol (VITAMIN D3) 25 MCG (1000 UNIT) tablet Take 1,000 Units by mouth daily.   MAGNESIUM GLYCINATE PO Take 240 mg by mouth daily at 6 (six) AM.   metoprolol  succinate (TOPROL  XL) 25 MG 24 hr tablet Take 0.5 tablets (12.5 mg total) by mouth daily. Ok to take additional half dose metoprolol  (12.5 mg) if blood pressure 150/90   propranolol  (INDERAL ) 10 MG tablet Take 1 tablet (10 mg total) by mouth 3 (three) times daily as needed.    Allergies:   Patient has  no known allergies.   Social History   Socioeconomic History   Marital status: Married    Spouse name: Not on file   Number of children: Not on file   Years of education: Not on file   Highest education level: Not on file  Occupational History   Not on file  Tobacco Use   Smoking status: Never   Smokeless tobacco: Never  Vaping Use   Vaping status: Never Used  Substance and Sexual Activity   Alcohol use: Yes    Alcohol/week: 0.0 - 5.0 standard drinks of alcohol   Drug use: No   Sexual activity: Yes  Other Topics Concern   Not on file  Social History Narrative   Not on file   Social Drivers of Health   Financial Resource Strain: Not on file  Food Insecurity: Not on file  Transportation Needs: Not on file  Physical Activity: Not on  file  Stress: Not on file (08/05/2019)  Social Connections: Unknown (11/06/2021)   Received from [facility]   Social Network    Social Network: Not on file     Family History:  The patient's family history includes Coronary artery disease in her brother.   ROS:   Please see the history of present illness.    ROS All other systems reviewed and are negative.      No data to display           PHYSICAL EXAM:   VS:  BP 120/72   Pulse 80   Ht 5' 3.5 (1.613 m)   Wt 147 lb 9.6 oz (67 kg)   SpO2 98%   BMI 25.74 kg/m    GEN: Well nourished, well developed in no acute distress HEENT: Normal NECK: No JVD; No carotid bruits LYMPHATICS: No lymphadenopathy CARDIAC:RRR, no murmurs, rubs, gallops RESPIRATORY:  Clear to auscultation without rales, wheezing or rhonchi  ABDOMEN: Soft, non-tender, non-distended MUSCULOSKELETAL:  No edema; No deformity  SKIN: Warm and dry NEUROLOGIC:  Alert and oriented x 3 PSYCHIATRIC:  Normal affect  Wt Readings from Last 3 Encounters:  02/09/24 147 lb 9.6 oz (67 kg)  07/29/23 149 lb 9.6 oz (67.9 kg)  03/06/23 148 lb (67.1 kg)      Studies/Labs Reviewed:   EKG Interpretation Date/Time:  Monday February 09 2024 14:00:30 EDT Ventricular Rate:  80 PR Interval:  158 QRS Duration:  70 QT Interval:  388 QTC Calculation: 447 R Axis:   40  Text Interpretation: Normal sinus rhythm Possible Left atrial enlargement When compared with ECG of 30-Jan-2023 16:19, No significant change was found Confirmed by Shlomo Corning ([id number]) on 02/09/2024 2:05:42 PM    Recent Labs: No results found for requested labs within last 365 days.   Lipid Panel    Component Value Date/Time   CHOL 208 (H) 12/08/2023 0757   TRIG 73 12/08/2023 0757   HDL 79 12/08/2023 0757   CHOLHDL 2.6 12/08/2023 0757   LDLCALC 116 (H) 12/08/2023 0757    Additional studies/ records that were reviewed today include:  EKG and ER notes    ASSESSMENT:    1. Palpitations   2. PVC's  (premature ventricular contractions)   3. Premature atrial contraction   4. Coronary artery disease due to lipid rich plaque   5. Hyperlipidemia LDL goal <70        PLAN:  In order of problems listed above:   Palpitations/PVCs/PACs -She has a long history of palpitations with a history of PVCs and PACs  noted on 2 monitors  -I have recommended that she check her EKG on her apple watch and Kardia mobile when she has palpitations>>she has been doing this and every time she did it there was no afib>>suspect she is having PACs and PVCs. -Continue Toprol  XL 12.5 mg daily with as needed refills  2.  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  Hyperlipidemia -LDL goal less than 70 -I have personally reviewed and interpreted outside labs performed by patient's PCP which showed LDL 116, HDL 79, triglycerides 73 on 12/08/2023, ALT 25 on 06/20/2023 -Continue atorvastatin  40 mg daily with as needed refills (this was increased on 12/10/2023) -Repeat FLP and ALT  Follow-up :1 year  Time Spent: 20 minutes total time of encounter, including 15 minutes spent in face-to-face patient care on the date of this encounter. This time includes coordination of care and counseling regarding above mentioned problem list. Remainder of non-face-to-face time involved reviewing chart documents/testing relevant to the patient encounter and documentation in the medical record. I have independently reviewed documentation from referring provider  Medication Adjustments/Labs and Tests  Ordered: Current medicines are reviewed at length with the patient today.  Concerns regarding medicines are outlined above.  Medication changes, Labs and Tests ordered today are listed in the Patient Instructions below.  There are no Patient Instructions on file for this visit.   Signed, Wilbert Bihari, MD  02/09/2024 2:10 PM    [facility] [address] [place], [place], [place]  [address] Phone: [phone]; Fax: [phone]

## 2024-02-09 NOTE — Patient Instructions (Signed)

## 2024-02-10 ENCOUNTER — Ambulatory Visit (INDEPENDENT_AMBULATORY_CARE_PROVIDER_SITE_OTHER)

## 2024-02-10 ENCOUNTER — Encounter: Payer: Self-pay | Admitting: Cardiology

## 2024-02-10 DIAGNOSIS — R911 Solitary pulmonary nodule: Secondary | ICD-10-CM | POA: Diagnosis not present

## 2024-03-17 LAB — LIPID PANEL
Chol/HDL Ratio: 2.5 ratio (ref 0.0–4.4)
Cholesterol, Total: 195 mg/dL (ref 100–199)
HDL: 78 mg/dL (ref >39)
LDL Chol Calc (NIH): 102 mg/dL — ABNORMAL HIGH (ref 0–99)
Triglycerides: 85 mg/dL (ref 0–149)
VLDL Cholesterol Cal: 15 mg/dL (ref 5–40)

## 2024-03-17 LAB — ALT: ALT: 31 IU/L (ref 0–32)

## 2024-04-12 ENCOUNTER — Encounter: Payer: Self-pay | Admitting: Cardiology

## 2024-04-12 DIAGNOSIS — I2510 Atherosclerotic heart disease of native coronary artery without angina pectoris: Secondary | ICD-10-CM

## 2024-04-12 DIAGNOSIS — E785 Hyperlipidemia, unspecified: Secondary | ICD-10-CM

## 2024-05-05 MED ORDER — ATORVASTATIN CALCIUM 80 MG PO TABS: 80.0000 mg | ORAL_TABLET | Freq: Every day | ORAL | 3 refills | Status: AC

## 2024-06-18 ENCOUNTER — Other Ambulatory Visit: Payer: Self-pay | Admitting: Emergency Medicine

## 2024-06-18 DIAGNOSIS — I2510 Atherosclerotic heart disease of native coronary artery without angina pectoris: Secondary | ICD-10-CM

## 2024-06-18 DIAGNOSIS — E785 Hyperlipidemia, unspecified: Secondary | ICD-10-CM

## 2024-06-18 LAB — LIPID PANEL
Chol/HDL Ratio: 2.3 ratio (ref 0.0–4.4)
Cholesterol, Total: 176 mg/dL (ref 100–199)
HDL: 76 mg/dL
LDL Chol Calc (NIH): 86 mg/dL (ref 0–99)
Triglycerides: 72 mg/dL (ref 0–149)
VLDL Cholesterol Cal: 14 mg/dL (ref 5–40)

## 2024-06-18 LAB — ALT: ALT: 37 IU/L — ABNORMAL HIGH (ref 0–32)

## 2024-06-19 ENCOUNTER — Ambulatory Visit: Payer: Self-pay | Admitting: Cardiology

## 2024-06-21 ENCOUNTER — Other Ambulatory Visit: Payer: Self-pay

## 2024-06-21 DIAGNOSIS — E785 Hyperlipidemia, unspecified: Secondary | ICD-10-CM

## 2024-06-21 DIAGNOSIS — R931 Abnormal findings on diagnostic imaging of heart and coronary circulation: Secondary | ICD-10-CM

## 2024-06-21 DIAGNOSIS — I700 Atherosclerosis of aorta: Secondary | ICD-10-CM

## 2024-07-02 ENCOUNTER — Other Ambulatory Visit: Payer: Self-pay | Admitting: Cardiology

## 2024-08-10 ENCOUNTER — Ambulatory Visit: Admitting: Pharmacist Clinician (PhC)/ Clinical Pharmacy Specialist
